# Patient Record
Sex: Female | Born: 1990 | Race: White | Hispanic: No | Marital: Married | Smoking: Former smoker
Health system: Southern US, Community
[De-identification: ages and names within clinical notes are randomized; demographics above are authoritative.]

## PROBLEM LIST (undated history)

## (undated) DIAGNOSIS — F32A Depression, unspecified: Secondary | ICD-10-CM

## (undated) DIAGNOSIS — E039 Hypothyroidism, unspecified: Secondary | ICD-10-CM

## (undated) DIAGNOSIS — E079 Disorder of thyroid, unspecified: Secondary | ICD-10-CM

## (undated) DIAGNOSIS — F329 Major depressive disorder, single episode, unspecified: Secondary | ICD-10-CM

## (undated) DIAGNOSIS — Z720 Tobacco use: Secondary | ICD-10-CM

## (undated) DIAGNOSIS — F319 Bipolar disorder, unspecified: Secondary | ICD-10-CM

## (undated) DIAGNOSIS — Z87442 Personal history of urinary calculi: Secondary | ICD-10-CM

## (undated) DIAGNOSIS — K219 Gastro-esophageal reflux disease without esophagitis: Secondary | ICD-10-CM

## (undated) DIAGNOSIS — R7303 Prediabetes: Secondary | ICD-10-CM

## (undated) HISTORY — PX: TOTAL ABDOMINAL HYSTERECTOMY: SHX209

## (undated) HISTORY — DX: Bipolar disorder, unspecified: F31.9

## (undated) HISTORY — DX: Depression, unspecified: F32.A

## (undated) HISTORY — PX: ABDOMINAL HYSTERECTOMY: SHX81

## (undated) HISTORY — DX: Major depressive disorder, single episode, unspecified: F32.9

## (undated) HISTORY — PX: MOUTH SURGERY: SHX715

## (undated) HISTORY — DX: Disorder of thyroid, unspecified: E07.9

## (undated) HISTORY — DX: Tobacco use: Z72.0

---
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
## 2012-01-21 ENCOUNTER — Observation Stay: Payer: Self-pay

## 2012-01-21 LAB — CBC WITH DIFFERENTIAL/PLATELET
Eosinophil %: 1.5 %
Lymphocyte #: 1.8 10*3/uL (ref 1.0–3.6)
MCH: 31.2 pg (ref 26.0–34.0)
MCV: 86 fL (ref 80–100)
Monocyte %: 7.7 %
Neutrophil #: 10.6 10*3/uL — ABNORMAL HIGH (ref 1.4–6.5)
RBC: 3.5 10*6/uL — ABNORMAL LOW (ref 3.80–5.20)
WBC: 13.6 10*3/uL — ABNORMAL HIGH (ref 3.6–11.0)

## 2012-01-21 LAB — COMPREHENSIVE METABOLIC PANEL
Albumin: 2.5 g/dL — ABNORMAL LOW (ref 3.8–5.6)
Calcium, Total: 8 mg/dL — ABNORMAL LOW (ref 9.0–10.7)
Co2: 24 mmol/L (ref 16–25)
EGFR (Non-African Amer.): >60
Potassium: 3.4 mmol/L (ref 3.3–4.7)
Sodium: 143 mmol/L — ABNORMAL HIGH (ref 132–141)

## 2012-01-21 LAB — URINALYSIS, COMPLETE
Bacteria: NONE SEEN
Bilirubin,UR: NEGATIVE
Blood: NEGATIVE
Glucose,UR: NEGATIVE mg/dL (ref 0–75)
Ketone: NEGATIVE
Leukocyte Esterase: NEGATIVE
Ph: 7 (ref 4.5–8.0)
RBC,UR: NONE SEEN /HPF (ref 0–5)
Squamous Epithelial: NONE SEEN

## 2012-01-22 ENCOUNTER — Emergency Department: Payer: Self-pay | Admitting: Unknown Physician Specialty

## 2012-01-22 LAB — COMPREHENSIVE METABOLIC PANEL
Albumin: 2.6 g/dL — ABNORMAL LOW (ref 3.8–5.6)
Anion Gap: 9 (ref 7–16)
Chloride: 107 mmol/L (ref 97–107)
Creatinine: 0.51 mg/dL — ABNORMAL LOW (ref 0.60–1.30)
Potassium: 3.8 mmol/L (ref 3.3–4.7)
SGOT(AST): 19 U/L (ref 0–26)
SGPT (ALT): 28 U/L
Total Protein: 6.2 g/dL — ABNORMAL LOW (ref 6.4–8.6)

## 2012-01-22 LAB — CBC WITH DIFFERENTIAL/PLATELET
Basophil %: 0.5 %
Eosinophil #: 0.2 10*3/uL (ref 0.0–0.7)
HCT: 33 % — ABNORMAL LOW (ref 35.0–47.0)
HGB: 11.9 g/dL — ABNORMAL LOW (ref 12.0–16.0)
Lymphocyte %: 12.7 %
Monocyte %: 8 %
Neutrophil #: 8.3 10*3/uL — ABNORMAL HIGH (ref 1.4–6.5)
RBC: 3.77 10*6/uL — ABNORMAL LOW (ref 3.80–5.20)
RDW: 13.3 % (ref 11.5–14.5)
WBC: 10.7 10*3/uL (ref 3.6–11.0)

## 2012-03-03 ENCOUNTER — Observation Stay: Payer: Self-pay

## 2012-03-03 LAB — URINALYSIS, COMPLETE
Bilirubin,UR: NEGATIVE
Ph: 7 (ref 4.5–8.0)
Protein: 30

## 2012-03-05 LAB — URINE CULTURE

## 2012-05-15 LAB — CBC WITH DIFFERENTIAL/PLATELET
Eosinophil %: 0.4 %
HGB: 12.6 g/dL (ref 12.0–16.0)
Lymphocyte %: 10.5 %
MCH: 30.5 pg (ref 26.0–34.0)
Monocyte #: 0.8 x10 3/mm (ref 0.2–0.9)
Monocyte %: 6.1 %
Neutrophil %: 82.4 %
Platelet: 192 10*3/uL (ref 150–440)
RBC: 4.14 10*6/uL (ref 3.80–5.20)
WBC: 13.1 10*3/uL — ABNORMAL HIGH (ref 3.6–11.0)

## 2012-05-16 ENCOUNTER — Inpatient Hospital Stay: Payer: Self-pay

## 2012-11-24 ENCOUNTER — Emergency Department: Payer: Self-pay | Admitting: Emergency Medicine

## 2012-11-24 LAB — COMPREHENSIVE METABOLIC PANEL
Alkaline Phosphatase: 104 U/L (ref 82–169)
Anion Gap: 5 — ABNORMAL LOW (ref 7–16)
BUN: 14 mg/dL (ref 7–18)
Bilirubin,Total: 0.2 mg/dL (ref 0.2–1.0)
Calcium, Total: 9.1 mg/dL (ref 9.0–10.7)
Osmolality: 278 (ref 275–301)

## 2012-11-24 LAB — URINALYSIS, COMPLETE
Glucose,UR: NEGATIVE mg/dL (ref 0–75)
Nitrite: NEGATIVE
Ph: 5 (ref 4.5–8.0)

## 2012-11-24 LAB — CBC
HCT: 40.5 % (ref 35.0–47.0)
HGB: 14.1 g/dL (ref 12.0–16.0)
MCHC: 34.7 g/dL (ref 32.0–36.0)

## 2012-11-24 LAB — DRUG SCREEN, URINE
Amphetamines, Ur Screen: NEGATIVE (ref ?–1000)
Tricyclic, Ur Screen: NEGATIVE (ref ?–1000)

## 2012-11-24 LAB — SALICYLATE LEVEL: Salicylates, Serum: <1.7 mg/dL

## 2012-11-24 LAB — TSH: Thyroid Stimulating Horm: 4.61 u[IU]/mL — ABNORMAL HIGH

## 2013-02-26 ENCOUNTER — Emergency Department: Payer: Self-pay | Admitting: Emergency Medicine

## 2013-02-26 LAB — COMPREHENSIVE METABOLIC PANEL
Bilirubin,Total: 0.2 mg/dL (ref 0.2–1.0)
EGFR (Non-African Amer.): >60
Potassium: 3.8 mmol/L (ref 3.5–5.1)
SGOT(AST): 22 U/L (ref 15–37)
SGPT (ALT): 27 U/L (ref 12–78)
Total Protein: 6.8 g/dL (ref 6.4–8.2)

## 2013-02-26 LAB — CBC
HGB: 13.2 g/dL (ref 12.0–16.0)
MCV: 83 fL (ref 80–100)
Platelet: 211 10*3/uL (ref 150–440)
RDW: 13.8 % (ref 11.5–14.5)

## 2013-02-26 LAB — DRUG SCREEN, URINE
Benzodiazepine, Ur Scrn: NEGATIVE (ref ?–200)
Cannabinoid 50 Ng, Ur ~~LOC~~: NEGATIVE (ref ?–50)
Cocaine Metabolite,Ur ~~LOC~~: NEGATIVE (ref ?–300)
MDMA (Ecstasy)Ur Screen: NEGATIVE (ref ?–500)
Opiate, Ur Screen: NEGATIVE (ref ?–300)
Phencyclidine (PCP) Ur S: NEGATIVE (ref ?–25)

## 2013-02-26 LAB — URINALYSIS, COMPLETE
Bilirubin,UR: NEGATIVE
Ketone: NEGATIVE
Leukocyte Esterase: NEGATIVE
Nitrite: NEGATIVE
Specific Gravity: 1.027 (ref 1.003–1.030)
Squamous Epithelial: 8

## 2013-07-29 ENCOUNTER — Emergency Department: Payer: Self-pay | Admitting: Emergency Medicine

## 2013-07-29 LAB — CBC WITH DIFFERENTIAL/PLATELET
BASOS PCT: 0.6 %
Basophil #: 0 10*3/uL (ref 0.0–0.1)
Eosinophil #: 0.2 10*3/uL (ref 0.0–0.7)
Eosinophil %: 2.3 %
HCT: 38.3 % (ref 35.0–47.0)
HGB: 13.2 g/dL (ref 12.0–16.0)
Lymphocyte #: 1.7 10*3/uL (ref 1.0–3.6)
Lymphocyte %: 24.5 %
MCH: 29.2 pg (ref 26.0–34.0)
MCHC: 34.4 g/dL (ref 32.0–36.0)
MCV: 85 fL (ref 80–100)
Monocyte #: 0.6 x10 3/mm (ref 0.2–0.9)
Monocyte %: 9.2 %
Neutrophil #: 4.4 10*3/uL (ref 1.4–6.5)
Neutrophil %: 63.4 %
Platelet: 199 10*3/uL (ref 150–440)
RBC: 4.51 10*6/uL (ref 3.80–5.20)
RDW: 13.4 % (ref 11.5–14.5)
WBC: 6.9 10*3/uL (ref 3.6–11.0)

## 2013-07-29 LAB — URINALYSIS, COMPLETE
Bacteria: NONE SEEN
Bilirubin,UR: NEGATIVE
GLUCOSE, UR: NEGATIVE mg/dL (ref 0–75)
KETONE: NEGATIVE
Nitrite: NEGATIVE
PH: 6 (ref 4.5–8.0)
Protein: NEGATIVE
SPECIFIC GRAVITY: 1.02 (ref 1.003–1.030)
WBC UR: 17 /HPF (ref 0–5)

## 2013-07-29 LAB — BASIC METABOLIC PANEL
Anion Gap: 6 — ABNORMAL LOW (ref 7–16)
BUN: 13 mg/dL (ref 7–18)
CALCIUM: 8.9 mg/dL (ref 8.5–10.1)
CHLORIDE: 107 mmol/L (ref 98–107)
CO2: 26 mmol/L (ref 21–32)
CREATININE: 0.85 mg/dL (ref 0.60–1.30)
EGFR (African American): >60
Glucose: 87 mg/dL (ref 65–99)
Osmolality: 277 (ref 275–301)
POTASSIUM: 4.1 mmol/L (ref 3.5–5.1)
SODIUM: 139 mmol/L (ref 136–145)

## 2013-07-29 LAB — WET PREP, GENITAL

## 2013-07-29 LAB — GC/CHLAMYDIA PROBE AMP

## 2013-09-30 ENCOUNTER — Emergency Department: Payer: Self-pay | Admitting: Emergency Medicine

## 2013-09-30 LAB — COMPREHENSIVE METABOLIC PANEL
ALBUMIN: 3.3 g/dL — AB (ref 3.4–5.0)
ALK PHOS: 75 U/L
ALT: 28 U/L (ref 12–78)
ANION GAP: 5 — AB (ref 7–16)
AST: 21 U/L (ref 15–37)
BUN: 11 mg/dL (ref 7–18)
Bilirubin,Total: 0.2 mg/dL (ref 0.2–1.0)
CO2: 25 mmol/L (ref 21–32)
Calcium, Total: 8.4 mg/dL — ABNORMAL LOW (ref 8.5–10.1)
Chloride: 108 mmol/L — ABNORMAL HIGH (ref 98–107)
Creatinine: 0.69 mg/dL (ref 0.60–1.30)
EGFR (African American): >60
EGFR (Non-African Amer.): >60
GLUCOSE: 90 mg/dL (ref 65–99)
Osmolality: 275 (ref 275–301)
Potassium: 4 mmol/L (ref 3.5–5.1)
Sodium: 138 mmol/L (ref 136–145)
TOTAL PROTEIN: 7.1 g/dL (ref 6.4–8.2)

## 2013-09-30 LAB — CBC
HCT: 38.2 % (ref 35.0–47.0)
HGB: 12.8 g/dL (ref 12.0–16.0)
MCH: 28.1 pg (ref 26.0–34.0)
MCHC: 33.5 g/dL (ref 32.0–36.0)
MCV: 84 fL (ref 80–100)
Platelet: 223 10*3/uL (ref 150–440)
RBC: 4.56 10*6/uL (ref 3.80–5.20)
RDW: 13.8 % (ref 11.5–14.5)
WBC: 7.6 10*3/uL (ref 3.6–11.0)

## 2013-09-30 LAB — URINALYSIS, COMPLETE
Bacteria: NONE SEEN
SPECIFIC GRAVITY: 1.02 (ref 1.003–1.030)

## 2013-09-30 LAB — TROPONIN I: Troponin-I: <0.02 ng/mL

## 2013-09-30 LAB — TSH: Thyroid Stimulating Horm: 2.9 u[IU]/mL

## 2013-09-30 LAB — CK TOTAL AND CKMB (NOT AT ARMC)
CK, TOTAL: 144 U/L
CK-MB: 1.2 ng/mL (ref 0.5–3.6)

## 2013-09-30 LAB — PREGNANCY, URINE: Pregnancy Test, Urine: NEGATIVE m[IU]/mL
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
## 2014-10-31 NOTE — H&P (Signed)
L&D Evaluation:  History:   HPI 24 year old G1P0 at 40 weeks 1 day presents to L&D with ruptured membranes. EDD 05/14/12, [place] at [facility] notable for early entry to care, kidney stone at 24 weeks, no other significant events.  Labs: O Pos, RI, Varicella non immune GBS POSITIVE    Presents with leaking fluid    Patient's Medical History No Chronic Illness    Patient's Surgical History none    Medications Pre Natal Vitamins    Allergies NKDA    Social History none    Family History Non-Contributory   ROS:   ROS All systems were reviewed.  HEENT, CNS, GI, GU, Respiratory, CV, Renal and Musculoskeletal systems were found to be normal.   Exam:   Vital Signs stable    Urine Protein not completed    General no apparent distress    Mental Status clear    Chest clear    Abdomen gravid, non-tender    Estimated Fetal Weight Average for gestational age    Edema no edema    Pelvic no external lesions, 2-3/70/-1    Mebranes Ruptured    Description clear    FHT normal rate with no decels    Ucx irregular   Impression:   Impression early labor   Plan:   Plan EFM/NST, monitor contractions and for cervical change, antibiotics for GBBS prophylaxis, start Pitocin for augmentation if no regular ctx's in 1-2 hours   Electronic Signatures: Shann Medal (CNM)  (Signed [phone] 20:18)  Authored: L&D Evaluation   Last Updated: 23-Nov-13 20:18 by Shann Medal (CNM)

## 2014-10-31 NOTE — H&P (Signed)
L&D Evaluation:  History Expanded:   HPI 24 yo G1 at 24 weeks (per pt, no records available.) Presents with c/o low abdominal and right upper quadrant pain since this afternoon, gradual onset and is now a sharp pain. Also c/o headache, with same complaint the last few nights, relieved with Tylenol and sleep. Pt ate tomato biscuit and roast beef sandwich today. Denies constipation, nausea or diarrhea. +FM, no LOF or VB. PNC at [facility].    Patient's Medical History No Chronic Illness    Patient's Surgical History none    Medications Pre Natal Vitamins    Allergies NKDA   ROS:   ROS see HPI   Exam:   Vital Signs stable    General no apparent distress    Mental Status clear    Chest clear    Heart no murmur/gallop/rubs    Abdomen gravid, RUQ tenderness (mild), lower central tenderness    Edema no edema  pt reports h/o pedal edema    FHT + FHR, appropriate for gestational age    Ucx absent    Other UA from In & out catheter: negative   Impression:   Impression IUP at 24 weeks with abdominal pain   Plan:   Comments Fioricet for headache Heat pack for abdominal pain GB Ultrasound   Electronic Signatures: Ander Purpura (CNM)  (Signed 31-Jul-13 19:50)  Authored: L&D Evaluation   Last Updated: 31-Jul-13 19:50 by Ander Purpura (CNM)

## 2014-12-04 ENCOUNTER — Ambulatory Visit: Payer: Medicaid Other | Admitting: Licensed Clinical Social Worker

## 2014-12-04 ENCOUNTER — Other Ambulatory Visit: Payer: Self-pay

## 2014-12-04 DIAGNOSIS — F319 Bipolar disorder, unspecified: Secondary | ICD-10-CM | POA: Insufficient documentation

## 2014-12-04 DIAGNOSIS — F332 Major depressive disorder, recurrent severe without psychotic features: Secondary | ICD-10-CM | POA: Insufficient documentation

## 2014-12-04 DIAGNOSIS — Z7189 Other specified counseling: Secondary | ICD-10-CM | POA: Insufficient documentation

## 2014-12-04 DIAGNOSIS — F32A Depression, unspecified: Secondary | ICD-10-CM | POA: Insufficient documentation

## 2014-12-04 DIAGNOSIS — F329 Major depressive disorder, single episode, unspecified: Secondary | ICD-10-CM | POA: Insufficient documentation

## 2014-12-05 ENCOUNTER — Ambulatory Visit: Payer: Medicaid Other | Admitting: Licensed Clinical Social Worker

## 2014-12-12 ENCOUNTER — Ambulatory Visit (INDEPENDENT_AMBULATORY_CARE_PROVIDER_SITE_OTHER): Payer: Medicaid Other | Admitting: Licensed Clinical Social Worker

## 2014-12-12 DIAGNOSIS — F411 Generalized anxiety disorder: Secondary | ICD-10-CM

## 2014-12-12 DIAGNOSIS — F331 Major depressive disorder, recurrent, moderate: Secondary | ICD-10-CM

## 2014-12-12 NOTE — Progress Notes (Signed)
THERAPIST PROGRESS NOTE  Session Time:   1:10 p.m. - 2:15 p.m.  Participation Level: Active  Behavioral Response: CasualAlertAnxious and Depressed  Type of Therapy: Individual Therapy  Treatment Goals addressed: Anger, Communication: Family conflict and Coping  Interventions: CBT, Solution Focused, Strength-based, Supportive and Reframing  Summary: Arbadella Kimbler is a 24 y.o. female who presents with ongoing depression and anxiety, complicated further by family dynamics, specifically need for validation and praise from her mother who client describes as not being satisfied with client's choices and who client experiences as critical.  "My anxiety has gotten so bad that I don't want to get up and get dressed."  Worse over the past week and a half. Fiance has noticed that Danaysha has been more on edge and with hands shaking and difficulty focusing.  Client descrbed being on edge as "cranky and I'm very irritable."  "I'm stressing about everything."  New stressor around the preparations for making a move to a home next to paternal grand-parents and purchasing appliances for the home.  She and spouse are excited about this yet Ednamae shared various comments from her mother that seem to be hurtful and cause some doubt in client's mind.  Fiance', Will continues to be supportive per Joellen Jersey.  He has officially adopted her almost 71 year old daughter, Addy, and this was another decision that client's mother disagreed with.  Domingo Mend' has suggested to client that she needs to set more firm boundaries with her mother.  She shared recent argument that she and fiance' got into around sharing responsibilities and she voiced some disappointment in her approach with him.  Kimba admits that fiance' does nice things for her yet her preference would be that he would do more of these things and on a consistent basis.  Client voiced much confusion around "Why does she do this to me? I don't understand where all of the anger  is coming from and why it come to me."  Family dynamics were discussed in terms of how her parents use to argue and ultimately what ended the marriage with Latiya stating "I'm glad they got divorced."  Historically it sounds as if both client's mother and father had anger issues and there were frequent conflicts between the parents.  Rilla admits that in 2014 when seeing Dr. Annitta Jersey and this therapist that she only took the medications prescribed for about two months.  Client is at a place where she would like a medication evaluation and seems ready to be compliant.  She also recognizes that she needs to increase her social contacts/supports citing "I tell my momma everything."   Goal:  "Managing anger better and working to try to get my anxiety down."   Suicidal/Homicidal: Negativewithout intent/plan  Therapist Response: Explored with client and processed identified thoughts, feelings and fears associated with both real and imagined rejection and abandonment in personal relationships. Assisted client to begin to increase insight and identification into patterns of certain behaviors and the resulting consequences.  Used motivational interviewing to assist and encourage patient through the change process and reinforced importance of understanding and setting healthier boundaries with her mother which may include increasing physical and emotional distance.  Validated feelings expressed.  Offered psycho-education on CBT to assist patient with the identification of negative distortions and irrational thoughts. Encouraged patient to verbalize alternative and factual responses which challenge thought distortions.   Provided Birtie with a hand out that addresses anger and how this and depression can be cover ups for underlying  feelings like shame and guilt and vulnerability.    Diagnosis:   Major Depressive Disorder, Recurrent, Moderate  (Rule out Bi-polar Disorder, MRE, Mixed, Moderate)   Generalized Anxiety  Disorder  Plan: Return again in 2 weeks.  Nakeshia will call back to [facility] to schedule an appointment with one of our Psychiatrist.  She is aware of services at [facility] walk in clinic and will follow up there for medication evaluation PRN.  Client will keep all appointments as scheduled.     Miguel Dibble, LCSW 12/12/2014

## 2014-12-18 ENCOUNTER — Ambulatory Visit: Payer: Medicaid Other | Admitting: Psychiatry

## 2015-01-24 ENCOUNTER — Ambulatory Visit: Payer: Medicaid Other | Admitting: Psychiatry

## 2015-01-26 ENCOUNTER — Ambulatory Visit: Payer: Self-pay | Admitting: Licensed Clinical Social Worker

## 2015-02-16 ENCOUNTER — Ambulatory Visit: Payer: Medicaid Other | Admitting: Psychiatry

## 2016-01-23 ENCOUNTER — Ambulatory Visit (INDEPENDENT_AMBULATORY_CARE_PROVIDER_SITE_OTHER): Payer: Medicaid Other | Admitting: Unknown Physician Specialty

## 2016-01-23 ENCOUNTER — Encounter: Payer: Self-pay | Admitting: Unknown Physician Specialty

## 2016-01-23 DIAGNOSIS — D229 Melanocytic nevi, unspecified: Secondary | ICD-10-CM

## 2016-01-23 DIAGNOSIS — D239 Other benign neoplasm of skin, unspecified: Secondary | ICD-10-CM | POA: Diagnosis not present

## 2016-01-23 NOTE — Assessment & Plan Note (Signed)
With ABCD changes.  After informed consent, infiltrated area with lidocaine.  Nevi removed with punch biopsy and sent to pathology

## 2016-01-23 NOTE — Progress Notes (Signed)
BP (!) 141/78 (BP Location: Left Arm, Patient Position: Sitting, Cuff Size: Normal)   Pulse 81   Temp 97.9 F (36.6 C)   Ht 5' 6.1" (1.679 m)   Wt 177 lb (80.3 kg)   LMP 12/25/2015 (Exact Date)   SpO2 100%   BMI 28.48 kg/m    Subjective:    Patient ID: Donna Cox, female    DOB: 1991-04-08, 25 y.o.   MRN: [id number]  HPI: Donna Cox is a 25 y.o. female  Chief Complaint  Patient presents with  . Nevus    pt states she has a mole on her right arm that she would like looked at, states it has changed within the last month   Pt with mole on right arm which has grown dramatically in the last month.  No pain but it itches  Relevant past medical, surgical, family and social history reviewed and updated as indicated. Interim medical history since our last visit reviewed. Allergies and medications reviewed and updated.  Review of Systems  Per HPI unless specifically indicated above     Objective:    BP (!) 141/78 (BP Location: Left Arm, Patient Position: Sitting, Cuff Size: Normal)   Pulse 81   Temp 97.9 F (36.6 C)   Ht 5' 6.1" (1.679 m)   Wt 177 lb (80.3 kg)   LMP 12/25/2015 (Exact Date)   SpO2 100%   BMI 28.48 kg/m   Wt Readings from Last 3 Encounters:  01/23/16 177 lb (80.3 kg)  12/13/13 206 lb (93.4 kg)    Physical Exam  Constitutional: She is oriented to person, place, and time. She appears well-developed and well-nourished. No distress.  HENT:  Head: Normocephalic and atraumatic.  Eyes: Conjunctivae and lids are normal. Right eye exhibits no discharge. Left eye exhibits no discharge. No scleral icterus.  Neck: Normal range of motion. Neck supple. No JVD present. Carotid bruit is not present.  Cardiovascular: Normal rate, regular rhythm and normal heart sounds.   Pulmonary/Chest: Effort normal and breath sounds normal.  Abdominal: Normal appearance. There is no splenomegaly or hepatomegaly.  Musculoskeletal: Normal range of motion.  Neurological: She is  alert and oriented to person, place, and time.  Skin: Skin is warm, dry and intact. No rash noted. No pallor.  Mole right arm which is smaller than a pencil eraser but varigated coloring and irregular borders.    Psychiatric: She has a normal mood and affect. Her behavior is normal. Judgment and thought content normal.    Results for orders placed or performed in visit on 09/30/13  Troponin I  Result Value Ref Range   Troponin-I < 0.02 ng/mL  CK total and CKMB (cardiac)  Result Value Ref Range   CK, Total 144 Unit/L   CK-MB 1.2 0.5 - 3.6 ng/mL  CBC  Result Value Ref Range   WBC 7.6 3.6 - 11.0 x10 3/mm 3   RBC 4.56 3.80 - 5.20 X10 6/mm 3   HGB 12.8 12.0 - 16.0 g/dL   HCT 38.2 35.0 - 47.0 %   MCV 84 80 - 100 fL   MCH 28.1 26.0 - 34.0 pg   MCHC 33.5 32.0 - 36.0 g/dL   RDW 13.8 11.5 - 14.5 %   Platelet 223 150 - 440 x10 3/mm 3  Comprehensive metabolic panel  Result Value Ref Range   Glucose 90 65 - 99 mg/dL   BUN 11 7 - 18 mg/dL   Creatinine 0.69 0.60 - 1.30 mg/dL  Sodium 138 136 - 145 mmol/L   Potassium 4.0 3.5 - 5.1 mmol/L   Chloride 108 (H) 98 - 107 mmol/L   Co2 25 21 - 32 mmol/L   Calcium, Total 8.4 (L) 8.5 - 10.1 mg/dL   SGOT(AST) 21 15 - 37 Unit/L   SGPT (ALT) 28 12 - 78 U/L   Alkaline Phosphatase 75 Unit/L   Albumin 3.3 (L) 3.4 - 5.0 g/dL   Total Protein 7.1 6.4 - 8.2 g/dL   Bilirubin,Total 0.2 0.2 - 1.0 mg/dL   Osmolality 275 275 - 301   Anion Gap 5 (L) 7 - 16   EGFR (African American) >60    EGFR (Non-African Amer.) >60   TSH  Result Value Ref Range   Thyroid Stimulating Horm 2.90 uIU/mL  Urinalysis, Complete  Result Value Ref Range   Color - urine RED    Clarity - urine BLOODY    Glucose,UR see comment 0 - 75 mg/dL   Bilirubin,UR see comment NEGATIVE   Ketone see comment NEGATIVE   Specific Gravity 1.020 1.003 - 1.030   Blood see comment NEGATIVE   Ph see comment 4.5 - 8.0   Protein see comment NEGATIVE   Nitrite SEE COMMENT NEGATIVE   Leukocyte  Esterase see comment NEGATIVE   RBC,UR 3245 /HPF 0 - 5 /HPF   WBC UR 38 /HPF 0 - 5 /HPF   Bacteria NONE SEEN NONE SEEN   Squamous Epithelial 51 /HPF   Pregnancy, urine  Result Value Ref Range   Pregnancy Test, Urine NEGATIVE mIU/mL      Assessment & Plan:   Problem List Items Addressed This Visit      Unprioritized   Atypical nevi    With ABCD changes.  After informed consent, infiltrated area with lidocaine.  Nevi removed with punch biopsy and sent to pathology      Relevant Orders   Pathology    Other Visit Diagnoses   None.      Follow up plan: No Follow-up on file.

## 2016-01-28 LAB — PATHOLOGY

## 2016-03-10 ENCOUNTER — Ambulatory Visit
Admission: RE | Admit: 2016-03-10 | Discharge: 2016-03-10 | Disposition: A | Discharge status: Home / Self Care | Payer: Medicaid Other | Source: Ambulatory Visit | Attending: Family Medicine | Admitting: Family Medicine

## 2016-03-10 ENCOUNTER — Ambulatory Visit (INDEPENDENT_AMBULATORY_CARE_PROVIDER_SITE_OTHER): Payer: Medicaid Other | Admitting: Family Medicine

## 2016-03-10 ENCOUNTER — Encounter: Payer: Self-pay | Admitting: Family Medicine

## 2016-03-10 VITALS — BP 103/69 | HR 103 | Temp 99.3°F | Ht 66.5 in | Wt 182.0 lb

## 2016-03-10 DIAGNOSIS — X58XXXA Exposure to other specified factors, initial encounter: Secondary | ICD-10-CM | POA: Diagnosis not present

## 2016-03-10 DIAGNOSIS — S8992XA Unspecified injury of left lower leg, initial encounter: Secondary | ICD-10-CM

## 2016-03-10 NOTE — Patient Instructions (Signed)
Follow up as needed

## 2016-03-10 NOTE — Progress Notes (Signed)
   BP 103/69   Pulse (!) 103   Temp 99.3 F (37.4 C)   Ht 5' 6.5" (1.689 m)   Wt 182 lb (82.6 kg)   LMP 02/28/2016 (Exact Date)   SpO2 98%   BMI 28.94 kg/m    Subjective:    Patient ID: Donna Cox, female    DOB: 1991/03/04, 25 y.o.   MRN: [id number]  HPI: Donna Cox is a 25 y.o. female  Chief Complaint  Patient presents with  . Knee Pain    left knee since Sunday, but has been bothering off and on for years. Was running and heard a pop and has been swollen and bruised since.    Was playing sports yesterday, felt like she landed wrong and heard a pop. Swelling, bruising, both sharp and dull pain depending on activity. Can bear weight but it's very painful. Has been taking ibuprofen every 6 hours. Has been using heating pad and icy hot with no relief. No hx of injury in the knee.   Relevant past medical, surgical, family and social history reviewed and updated as indicated. Interim medical history since our last visit reviewed. Allergies and medications reviewed and updated.  Review of Systems  Constitutional: Negative.   HENT: Negative.   Respiratory: Negative.   Cardiovascular: Negative.   Gastrointestinal: Negative.   Genitourinary: Negative.   Musculoskeletal: Positive for arthralgias and joint swelling.  Skin:       Bruising   Neurological: Negative.   Psychiatric/Behavioral: Negative.     Per HPI unless specifically indicated above     Objective:    BP 103/69   Pulse (!) 103   Temp 99.3 F (37.4 C)   Ht 5' 6.5" (1.689 m)   Wt 182 lb (82.6 kg)   LMP 02/28/2016 (Exact Date)   SpO2 98%   BMI 28.94 kg/m   Wt Readings from Last 3 Encounters:  03/10/16 182 lb (82.6 kg)  01/23/16 177 lb (80.3 kg)  12/13/13 206 lb (93.4 kg)    Physical Exam  Constitutional: She is oriented to person, place, and time. She appears well-developed and well-nourished.  HENT:  Head: Atraumatic.  Eyes: Conjunctivae are normal. No scleral icterus.  Neck: Normal range of  motion. Neck supple.  Cardiovascular: Normal heart sounds.   Pulmonary/Chest: Effort normal. No respiratory distress.  Musculoskeletal: She exhibits edema (trace edema left knee) and tenderness (TTP over left patella).  Left knee: Pain with both varus and valgus stress Pain and significant crepitus to passive ROM  Neurological: She is alert and oriented to person, place, and time.  Skin: Skin is warm and dry.  Psychiatric: She has a normal mood and affect. Her behavior is normal.  Nursing note and vitals reviewed.     Assessment & Plan:   Problem List Items Addressed This Visit    None    Visit Diagnoses    Left knee injury, initial encounter    -  Primary   X-ray left knee today, await results. Continue ibuprofen, start knee brace, warm soaks, heating pad, icy hot massage, rest    Relevant Orders   DG Knee Complete 4 Views Left       Follow up plan: Return if symptoms worsen or fail to improve.

## 2016-03-11 ENCOUNTER — Telehealth: Payer: Self-pay | Admitting: Family Medicine

## 2016-03-11 NOTE — Telephone Encounter (Signed)
Patient notified

## 2016-03-11 NOTE — Telephone Encounter (Signed)
Please call pt and let her know that her knee x-ray was completely normal. She should continue wearing brace, resting, ice/heat, massage with icy hot, tylenol and ibuprofen for pain. If no improvement after 2 weeks or so, will send her to orthopedics for evaluation.

## 2016-03-24 ENCOUNTER — Telehealth: Payer: Self-pay | Admitting: Unknown Physician Specialty

## 2016-03-24 DIAGNOSIS — M545 Low back pain, unspecified: Secondary | ICD-10-CM

## 2016-03-24 NOTE — Telephone Encounter (Signed)
Called and spoke to patient. She stated that she has been having some tightness in her back. States she can pop it for some relief but then the pain comes back.

## 2016-03-24 NOTE — Telephone Encounter (Signed)
Pt called stated she would like a referral to a Chiropractor. Pt prefers Forensic psychologist in [place]. Please call if further information is needed. Thanks.

## 2016-09-18 ENCOUNTER — Ambulatory Visit (INDEPENDENT_AMBULATORY_CARE_PROVIDER_SITE_OTHER): Payer: Medicaid Other | Admitting: Family Medicine

## 2016-09-18 ENCOUNTER — Encounter: Payer: Self-pay | Admitting: Family Medicine

## 2016-09-18 VITALS — BP 116/62 | HR 94 | Temp 99.0°F | Wt 212.0 lb

## 2016-09-18 DIAGNOSIS — L84 Corns and callosities: Secondary | ICD-10-CM | POA: Diagnosis not present

## 2016-09-18 NOTE — Progress Notes (Signed)
BP 116/62   Pulse 94   Temp 99 F (37.2 C)   Wt 212 lb (96.2 kg)   LMP 09/08/2016 (Approximate)   SpO2 97%   BMI 33.71 kg/m    Subjective:    Patient ID: Donna Cox, female    DOB: 1991/01/24, 26 y.o.   MRN: [id number]  HPI: Donna Cox is a 26 y.o. female  Chief Complaint  Patient presents with  . Callouses    bottom of both feet, starting to loose feeling. Been going on for a while, but worse over the last month.   Patient presents with severe calluses on b/l feet that have been present her entire life, but starting to become worse the past month. Now experiencing some numbness in the areas they are severely cracked. For as long as she can remember, she has been doing the foot shavers and exfoliation after showers, and has tried almost every cream with no relief. Wears socks at night to help cream penetrate further. States her mother and sister also have this problem, but none of them have sought care for it.   Relevant past medical, surgical, family and social history reviewed and updated as indicated. Interim medical history since our last visit reviewed. Allergies and medications reviewed and updated.  Review of Systems  Constitutional: Negative.   Respiratory: Negative.   Cardiovascular: Negative.   Musculoskeletal: Negative.   Skin:       Calluses b/l feet  Neurological: Positive for numbness.  Psychiatric/Behavioral: Negative.    Per HPI unless specifically indicated above     Objective:    BP 116/62   Pulse 94   Temp 99 F (37.2 C)   Wt 212 lb (96.2 kg)   LMP 09/08/2016 (Approximate)   SpO2 97%   BMI 33.71 kg/m   Wt Readings from Last 3 Encounters:  09/18/16 212 lb (96.2 kg)  03/10/16 182 lb (82.6 kg)  01/23/16 177 lb (80.3 kg)    Physical Exam  Constitutional: She is oriented to person, place, and time. She appears well-developed and well-nourished. No distress.  HENT:  Head: Atraumatic.  Eyes: Conjunctivae are normal.  Neck: Normal range of  motion. Neck supple.  Cardiovascular: Normal rate.   Pulmonary/Chest: Effort normal. No respiratory distress.  Musculoskeletal: Normal range of motion.  Neurological: She is alert and oriented to person, place, and time.  Skin: Skin is warm and dry.  Significant, thick yellow calluses diffusely across plantar surface of b/l feet with multiple areas of deep cracking that are ttp  Psychiatric: She has a normal mood and affect. Her behavior is normal.  Nursing note and vitals reviewed.   Results for orders placed or performed in visit on 01/23/16  Pathology  Result Value Ref Range   PATH REPORT.SITE OF ORIGIN SPEC Comment    . Comment    PATH REPORT.FINAL DX SPEC Comment    SIGNED OUT BY: Comment    GROSS DESCRIPTION: Comment    . Comment    PAYMENT PROCEDURE Comment       Assessment & Plan:   Problem List Items Addressed This Visit    None    Visit Diagnoses    Foot callus    -  Primary   Given severity, discomfort, and poor response to self care, will refer to dermatology. Continue exfoliation, moisturizers, and good supportive shoe use   Relevant Orders   Ambulatory referral to Dermatology       Follow up plan: Return if symptoms worsen  or fail to improve.

## 2016-09-18 NOTE — Patient Instructions (Signed)
Follow up as needed

## 2016-10-10 ENCOUNTER — Ambulatory Visit (INDEPENDENT_AMBULATORY_CARE_PROVIDER_SITE_OTHER): Payer: Medicaid Other | Admitting: Unknown Physician Specialty

## 2016-10-10 ENCOUNTER — Encounter: Payer: Self-pay | Admitting: Unknown Physician Specialty

## 2016-10-10 DIAGNOSIS — E039 Hypothyroidism, unspecified: Secondary | ICD-10-CM | POA: Insufficient documentation

## 2016-10-10 DIAGNOSIS — Z8639 Personal history of other endocrine, nutritional and metabolic disease: Secondary | ICD-10-CM | POA: Diagnosis not present

## 2016-10-10 DIAGNOSIS — R635 Abnormal weight gain: Secondary | ICD-10-CM | POA: Diagnosis not present

## 2016-10-10 NOTE — Assessment & Plan Note (Signed)
Despite good exercise and healthy diet

## 2016-10-10 NOTE — Patient Instructions (Addendum)
Learning [employer] [email or web address]

## 2016-10-10 NOTE — Progress Notes (Signed)
   BP 137/79   Pulse 97   Temp 98.5 F (36.9 C)   Wt 218 lb 14.4 oz (99.3 kg)   LMP 10/06/2016   SpO2 98%   BMI 34.80 kg/m    Subjective:    Patient ID: Donna Cox, female    DOB: [birth year], 26 y.o.   MRN: [id number]  HPI: Donna Cox is a 26 y.o. female  Chief Complaint  Patient presents with  . Hypothyroidism  . Medication Refill    pt states she is here for a refill on levothyroxine   Pt is here for refills of her thyroid medications.  She is complaining of weight gain and fatigue.  States she has gained 40 pounds in 6 months despite going to the gym and a healthy diet. She wants to get married but would like to feel better first  History of Vit D deficiency and would like level checked.  Takes no supplements  Relevant past medical, surgical, family and social history reviewed and updated as indicated. Interim medical history since our last visit reviewed. Allergies and medications reviewed and updated.  Review of Systems  Per HPI unless specifically indicated above     Objective:    BP 137/79   Pulse 97   Temp 98.5 F (36.9 C)   Wt 218 lb 14.4 oz (99.3 kg)   LMP 10/06/2016   SpO2 98%   BMI 34.80 kg/m   Wt Readings from Last 3 Encounters:  10/10/16 218 lb 14.4 oz (99.3 kg)  09/18/16 212 lb (96.2 kg)  03/10/16 182 lb (82.6 kg)    Physical Exam  Constitutional: She is oriented to person, place, and time. She appears well-developed and well-nourished. No distress.  HENT:  Head: Normocephalic and atraumatic.  Eyes: Conjunctivae and lids are normal. Right eye exhibits no discharge. Left eye exhibits no discharge. No scleral icterus.  Neck: Normal range of motion. Neck supple. No JVD present. Carotid bruit is not present.  Cardiovascular: Normal rate, regular rhythm and normal heart sounds.   Pulmonary/Chest: Effort normal and breath sounds normal.  Abdominal: Normal appearance. There is no splenomegaly or hepatomegaly.  Musculoskeletal: Normal range of  motion.  Neurological: She is alert and oriented to person, place, and time.  Skin: Skin is warm, dry and intact. No rash noted. No pallor.  Psychiatric: She has a normal mood and affect. Her behavior is normal. Judgment and thought content normal.      Assessment & Plan:   Problem List Items Addressed This Visit      Unprioritized   Abnormal weight gain    Despite good exercise and healthy diet      Relevant Orders   Comprehensive metabolic panel   History of vitamin D deficiency   Relevant Orders   VITAMIN D 25 Hydroxy (Vit-D Deficiency, Fractures)   Hypothyroid    Feels symptomatic.  Will check levels      Relevant Orders   TSH       Follow up plan: Return for physical.

## 2016-10-10 NOTE — Assessment & Plan Note (Signed)
Feels symptomatic.  Will check levels

## 2016-10-11 LAB — VITAMIN D 25 HYDROXY (VIT D DEFICIENCY, FRACTURES): VIT D 25 HYDROXY: 27.2 ng/mL — AB (ref 30.0–100.0)

## 2016-10-11 LAB — COMPREHENSIVE METABOLIC PANEL
ALBUMIN: 3.9 g/dL (ref 3.5–5.5)
ALK PHOS: 90 IU/L (ref 39–117)
ALT: 20 IU/L (ref 0–32)
AST: 13 IU/L (ref 0–40)
Albumin/Globulin Ratio: 1.3 (ref 1.2–2.2)
BUN / CREAT RATIO: 15 (ref 9–23)
BUN: 12 mg/dL (ref 6–20)
Bilirubin Total: <0.2 mg/dL (ref 0.0–1.2)
CO2: 24 mmol/L (ref 18–29)
CREATININE: 0.8 mg/dL (ref 0.57–1.00)
Calcium: 9.2 mg/dL (ref 8.7–10.2)
Chloride: 102 mmol/L (ref 96–106)
GFR calc Af Amer: 120 mL/min/{1.73_m2} (ref >59)
GFR calc non Af Amer: 104 mL/min/{1.73_m2} (ref >59)
GLOBULIN, TOTAL: 3.1 g/dL (ref 1.5–4.5)
Glucose: 88 mg/dL (ref 65–99)
Potassium: 4.3 mmol/L (ref 3.5–5.2)
SODIUM: 142 mmol/L (ref 134–144)
Total Protein: 7 g/dL (ref 6.0–8.5)

## 2016-10-11 LAB — TSH: TSH: 3.7 u[IU]/mL (ref 0.450–4.500)

## 2016-10-13 ENCOUNTER — Telehealth: Payer: Self-pay | Admitting: Unknown Physician Specialty

## 2016-10-13 DIAGNOSIS — E039 Hypothyroidism, unspecified: Secondary | ICD-10-CM

## 2016-10-13 MED ORDER — LEVOTHYROXINE SODIUM 50 MCG PO TABS: 50.0000 ug | ORAL_TABLET | Freq: Every day | ORAL | 0 refills | Status: DC

## 2016-10-13 NOTE — Progress Notes (Signed)
Patient notified of results by phone.

## 2016-10-13 NOTE — Telephone Encounter (Signed)
Called to discuss labs.

## 2016-10-13 NOTE — Telephone Encounter (Signed)
TSH upper end of normal.  Symptomatic so will increase Levothyroxine.  Recheck TSH in 3 months

## 2016-10-31 ENCOUNTER — Emergency Department
Admission: EM | Admit: 2016-10-31 | Discharge: 2016-10-31 | Disposition: A | Discharge status: Home / Self Care | Payer: Medicaid Other | Attending: Emergency Medicine | Admitting: Emergency Medicine

## 2016-10-31 ENCOUNTER — Encounter: Payer: Self-pay | Admitting: Emergency Medicine

## 2016-10-31 ENCOUNTER — Emergency Department: Payer: Medicaid Other

## 2016-10-31 DIAGNOSIS — Y92094 Garage of other non-institutional residence as the place of occurrence of the external cause: Secondary | ICD-10-CM | POA: Diagnosis not present

## 2016-10-31 DIAGNOSIS — Y9389 Activity, other specified: Secondary | ICD-10-CM | POA: Insufficient documentation

## 2016-10-31 DIAGNOSIS — S80811A Abrasion, right lower leg, initial encounter: Secondary | ICD-10-CM | POA: Insufficient documentation

## 2016-10-31 DIAGNOSIS — Z79899 Other long term (current) drug therapy: Secondary | ICD-10-CM | POA: Diagnosis not present

## 2016-10-31 DIAGNOSIS — S8012XA Contusion of left lower leg, initial encounter: Secondary | ICD-10-CM | POA: Diagnosis not present

## 2016-10-31 DIAGNOSIS — Z23 Encounter for immunization: Secondary | ICD-10-CM | POA: Insufficient documentation

## 2016-10-31 DIAGNOSIS — Y998 Other external cause status: Secondary | ICD-10-CM | POA: Insufficient documentation

## 2016-10-31 DIAGNOSIS — S80812A Abrasion, left lower leg, initial encounter: Secondary | ICD-10-CM | POA: Insufficient documentation

## 2016-10-31 DIAGNOSIS — Z87891 Personal history of nicotine dependence: Secondary | ICD-10-CM | POA: Diagnosis not present

## 2016-10-31 DIAGNOSIS — S8992XA Unspecified injury of left lower leg, initial encounter: Secondary | ICD-10-CM | POA: Diagnosis present

## 2016-10-31 DIAGNOSIS — T07XXXA Unspecified multiple injuries, initial encounter: Secondary | ICD-10-CM

## 2016-10-31 MED ORDER — IBUPROFEN 600 MG PO TABS: 600.0000 mg | ORAL_TABLET | Freq: Three times a day (TID) | ORAL | 0 refills | Status: DC | PRN

## 2016-10-31 MED ORDER — TETANUS-DIPHTH-ACELL PERTUSSIS 5-2.5-18.5 LF-MCG/0.5 IM SUSP
0.5000 mL | Freq: Once | INTRAMUSCULAR | Status: AC
  Administered 2016-10-31: 0.5 mL via INTRAMUSCULAR
  Filled 2016-10-31: qty 0.5
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
## 2016-10-31 NOTE — Discharge Instructions (Signed)
Ice and elevate left leg as needed for swelling and pain. Ibuprofen 600 mg 3 times a day with food.  Clean any abrasions daily with mild soap and water. Watch for any signs of infection. Follow-up with your primary care provider if any continued problems.
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
## 2016-11-26 ENCOUNTER — Encounter: Payer: Medicaid Other | Admitting: Unknown Physician Specialty

## 2017-01-07 ENCOUNTER — Other Ambulatory Visit: Payer: Medicaid Other

## 2017-01-07 DIAGNOSIS — E039 Hypothyroidism, unspecified: Secondary | ICD-10-CM

## 2017-01-08 LAB — THYROID PANEL WITH TSH
FREE THYROXINE INDEX: 1.3 (ref 1.2–4.9)
T3 Uptake Ratio: 22 % — ABNORMAL LOW (ref 24–39)
T4, Total: 6 ug/dL (ref 4.5–12.0)
TSH: 5.15 u[IU]/mL — ABNORMAL HIGH (ref 0.450–4.500)

## 2017-01-09 ENCOUNTER — Other Ambulatory Visit: Payer: Self-pay | Admitting: Unknown Physician Specialty

## 2017-01-09 MED ORDER — LEVOTHYROXINE SODIUM 75 MCG PO TABS: 75.0000 ug | ORAL_TABLET | Freq: Every day | ORAL | 0 refills | Status: DC

## 2017-01-20 ENCOUNTER — Ambulatory Visit (INDEPENDENT_AMBULATORY_CARE_PROVIDER_SITE_OTHER): Payer: Medicaid Other | Admitting: Obstetrics and Gynecology

## 2017-01-20 ENCOUNTER — Encounter: Payer: Self-pay | Admitting: Obstetrics and Gynecology

## 2017-01-20 VITALS — BP 124/76 | HR 108 | Ht 68.0 in | Wt 230.0 lb

## 2017-01-20 DIAGNOSIS — Z6834 Body mass index (BMI) 34.0-34.9, adult: Secondary | ICD-10-CM | POA: Diagnosis not present

## 2017-01-20 DIAGNOSIS — N912 Amenorrhea, unspecified: Secondary | ICD-10-CM

## 2017-01-20 DIAGNOSIS — N911 Secondary amenorrhea: Secondary | ICD-10-CM | POA: Diagnosis not present

## 2017-01-20 DIAGNOSIS — E669 Obesity, unspecified: Secondary | ICD-10-CM | POA: Diagnosis not present

## 2017-01-20 LAB — POCT URINE PREGNANCY: PREG TEST UR: NEGATIVE

## 2017-01-20 NOTE — Progress Notes (Signed)
Obstetrics & Gynecology Office Visit   Chief Complaint:  Chief Complaint  Patient presents with  . Amenorrhea    irregular cycles  negative home test    History of Present Illness: 26 year old G1P1001 presenting for evaluation of irregular menstrual cycles.  Patient and her husband have been trying to conceive since last year.  Evaluation included normal semen analysis, normal PCOS panel other than evidence of hypothyroidism (her dose of synthroid was increased 2 weeks ago), as well as normal transvaginal ultrasound.  She opted for weight loss management and lost 45lbs in 3 months.  Her menses regulated with this weight loss and addition of synthroid.  She has since gained 50lbs in the last year.    Menses have since become abnormal again, with irregular intervals from 2-6 weeks, lasting up to 14 days.  She denies molimina symptoms.  No headaches, vision changes, or nipple discharge reported by patient.   Review of Systems: 10 point review of systems negative unless otherwise noted in HPI  Past Medical History:  Past Medical History:  Diagnosis Date  . Bipolar disorder ([place])   . Depression   . Thyroid disease   . Tobacco use     Past Surgical History:  History reviewed. No pertinent surgical history.  Gynecologic History: Patient's last menstrual period was 11/13/2016.  Obstetric History: G1P1001  Family History:  Family History  Problem Relation Age of Onset  . Mental illness Mother   . Diabetes Mother   . Thyroid disease Mother   . Heart disease Maternal Grandmother   . Heart disease Maternal Grandfather   . Cancer Maternal Grandfather        lung    Social History:  Social History   Social History  . Marital status: Single    Spouse name: N/A  . Number of children: N/A  . Years of education: N/A   Occupational History  . Not on file.   Social History Main Topics  . Smoking status: Former Smoker    Types: Cigarettes    Quit date: 06/23/2014  .  Smokeless tobacco: Never Used  . Alcohol use Yes     Comment: rare occasion  . Drug use: No  . Sexual activity: Yes   Other Topics Concern  . Not on file   Social History Narrative  . No narrative on file    Allergies:  Allergies  Allergen Reactions  . Anette Guarneri  [Lurasidone Hcl]     crying    Medications: Prior to Admission medications   Medication Sig Start Date End Date Taking? Authorizing Provider  levothyroxine (SYNTHROID, LEVOTHROID) 75 MCG tablet Take 1 tablet (75 mcg total) by mouth daily. 01/09/17  Yes Kathrine Haddock, NP    Physical Exam Vitals:  Vitals:   01/20/17 0821  BP: 124/76  Pulse: (!) 108   Filed Weights   01/20/17 0821  Weight: 230 lb (104.3 kg)    Patient's last menstrual period was 11/13/2016. Body mass index is 34.97 kg/m.  General: NAD HEENT: normocephalic, anicteric, +moon facies, +buffalo hump Pulmonary: No increased work of breathing Neurologic: Grossly intact Psychiatric: mood appropriate, affect full  Female chaperone present for pelvic and breast  portions of the physical exam  Assessment: 26 y.o. G1P1001 AUB-O Plan: Problem List Items Addressed This Visit    None    Visit Diagnoses    Amenorrhea    -  Primary   Relevant Orders   POCT urine pregnancy (Completed)     -  weight gain 50lbs in a year - Cushing features on exam check 24-hr urine cortisol  - PCOS panel - If work up normal has had semen analysis last year that was normal, proceed with letrozole or clomid - A total of 15 minutes were spent in face-to-face contact with the patient during this encounter with over half of that time devoted to counseling and coordination of care.

## 2017-01-20 NOTE — Patient Instructions (Signed)
24-hr urine collection.  Start by voiding in the morning lets say 8AM (that urine gets tossed), you will collect all voids in the next 24-hrs, at 8AM the next day go to the bathroom one last time and that is the last urine that gets collected.  Keep the urine refrigerated and drop it off in clinic at the end of the collection

## 2017-01-23 LAB — TSH+PRL+FSH+TESTT+LH+DHEA S...
17-Hydroxyprogesterone: 47 ng/dL
ANDROSTENEDIONE: 253 ng/dL (ref 41–262)
DHEA SO4: 44.8 ug/dL — AB (ref 110.0–431.7)
FSH: 4.6 m[IU]/mL
LH: 8 m[IU]/mL
Prolactin: 6.1 ng/mL (ref 4.8–23.3)
TESTOSTERONE FREE: 2.2 pg/mL (ref 0.0–4.2)
TSH: 4.52 u[IU]/mL — ABNORMAL HIGH (ref 0.450–4.500)
Testosterone: 43 ng/dL (ref 8–48)

## 2017-01-23 LAB — HEMOGLOBIN A1C
ESTIMATED AVERAGE GLUCOSE: 111 mg/dL
HEMOGLOBIN A1C: 5.5 % (ref 4.8–5.6)

## 2017-01-26 ENCOUNTER — Ambulatory Visit (INDEPENDENT_AMBULATORY_CARE_PROVIDER_SITE_OTHER): Payer: Medicaid Other | Admitting: Family Medicine

## 2017-01-26 ENCOUNTER — Encounter: Payer: Self-pay | Admitting: Family Medicine

## 2017-01-26 ENCOUNTER — Other Ambulatory Visit: Payer: Self-pay

## 2017-01-26 ENCOUNTER — Other Ambulatory Visit: Payer: Self-pay | Admitting: Obstetrics and Gynecology

## 2017-01-26 VITALS — BP 116/76 | HR 88 | Temp 99.3°F | Wt 228.0 lb

## 2017-01-26 DIAGNOSIS — Z6834 Body mass index (BMI) 34.0-34.9, adult: Secondary | ICD-10-CM

## 2017-01-26 DIAGNOSIS — N390 Urinary tract infection, site not specified: Secondary | ICD-10-CM

## 2017-01-26 DIAGNOSIS — E669 Obesity, unspecified: Secondary | ICD-10-CM

## 2017-01-26 DIAGNOSIS — N912 Amenorrhea, unspecified: Secondary | ICD-10-CM

## 2017-01-26 DIAGNOSIS — N911 Secondary amenorrhea: Secondary | ICD-10-CM

## 2017-01-26 MED ORDER — SULFAMETHOXAZOLE-TRIMETHOPRIM 800-160 MG PO TABS: 1.0000 | ORAL_TABLET | Freq: Two times a day (BID) | ORAL | 0 refills | Status: DC

## 2017-01-26 NOTE — Patient Instructions (Signed)
Follow up as needed

## 2017-01-26 NOTE — Progress Notes (Signed)
BP 116/76   Pulse 88   Temp 99.3 F (37.4 C) (Oral)   Wt 228 lb (103.4 kg)   SpO2 98%   BMI 34.67 kg/m    Subjective:    Patient ID: Donna Cox, female    DOB: 06-14-1991, 26 y.o.   MRN: [id number]  HPI: Donna Cox is a 26 y.o. female  Chief Complaint  Patient presents with  . uti SYMPTOMS   Patient presents with one day of sudden onset dysuria, suprapubic pressure, urgency, and hesitancy. Denies fever, chills, back pain, N/V/D. Has not been trying anything OTC for sxs.   Relevant past medical, surgical, family and social history reviewed and updated as indicated. Interim medical history since our last visit reviewed. Allergies and medications reviewed and updated.  Review of Systems  Constitutional: Negative.   HENT: Negative.   Respiratory: Negative.   Cardiovascular: Negative.   Gastrointestinal: Negative.   Genitourinary: Positive for dysuria, frequency and urgency.  Musculoskeletal: Negative.   Neurological: Negative.   Psychiatric/Behavioral: Negative.     Per HPI unless specifically indicated above     Objective:    BP 116/76   Pulse 88   Temp 99.3 F (37.4 C) (Oral)   Wt 228 lb (103.4 kg)   SpO2 98%   BMI 34.67 kg/m   Wt Readings from Last 3 Encounters:  01/26/17 228 lb (103.4 kg)  01/20/17 230 lb (104.3 kg)  10/31/16 190 lb (86.2 kg)    Physical Exam  Constitutional: She is oriented to person, place, and time. She appears well-developed and well-nourished. No distress.  HENT:  Head: Atraumatic.  Eyes: Pupils are equal, round, and reactive to light. Conjunctivae are normal.  Neck: Normal range of motion. Neck supple.  Cardiovascular: Normal rate and normal heart sounds.   Pulmonary/Chest: Effort normal and breath sounds normal. No respiratory distress.  Musculoskeletal: Normal range of motion. She exhibits no tenderness (no CVA tenderness).  Neurological: She is alert and oriented to person, place, and time.  Skin: Skin is warm and dry.    Psychiatric: She has a normal mood and affect.  Nursing note and vitals reviewed.  Results for orders placed or performed in visit on 01/26/17  Microscopic Examination  Result Value Ref Range   WBC, UA >30 (A) 0 - 5 /hpf   RBC, UA >30 (A) 0 - 2 /hpf   Epithelial Cells (non renal) >10 (A) 0 - 10 /hpf   Bacteria, UA Few None seen/Few  Urine Culture, Reflex  Result Value Ref Range   Urine Culture, Routine WILL FOLLOW   UA/M w/rflx Culture, Routine  Result Value Ref Range   Specific Gravity, UA 1.015 1.005 - 1.030   pH, UA 8.5 (H) 5.0 - 7.5   Color, UA Yellow Yellow   Appearance Ur Turbid (A) Clear   Leukocytes, UA 2+ (A) Negative   Protein, UA 2+ (A) Negative/Trace   Glucose, UA Negative Negative   Ketones, UA Trace (A) Negative   RBC, UA 3+ (A) Negative   Bilirubin, UA Negative Negative   Urobilinogen, Ur 0.2 0.2 - 1.0 mg/dL   Nitrite, UA Negative Negative   Microscopic Examination See below:    Urinalysis Reflex Comment       Assessment & Plan:   Problem List Items Addressed This Visit    None    Visit Diagnoses    Acute lower UTI    -  Primary   U/A positive for UTI. Will start bactrim. Push fluids, tylenol  prn. Await urine culture. F/u if worsening or no improvement   Relevant Medications   sulfamethoxazole-trimethoprim (BACTRIM DS,SEPTRA DS) 800-160 MG tablet   Other Relevant Orders   UA/M w/rflx Culture, Routine (Completed)       Follow up plan: Return if symptoms worsen or fail to improve.

## 2017-01-29 LAB — CORTISOL, URINE, FREE
Cortisol (Ur), Free: 30 ug/24 hr (ref 0–50)
Cortisol,F,ug/L,U: 23 ug/L

## 2017-01-30 LAB — UA/M W/RFLX CULTURE, ROUTINE
BILIRUBIN UA: NEGATIVE
GLUCOSE, UA: NEGATIVE
NITRITE UA: NEGATIVE
SPEC GRAV UA: 1.015 (ref 1.005–1.030)
UUROB: 0.2 mg/dL (ref 0.2–1.0)
pH, UA: 8.5 — ABNORMAL HIGH (ref 5.0–7.5)

## 2017-01-30 LAB — MICROSCOPIC EXAMINATION: Epithelial Cells (non renal): >10 /hpf — AB (ref 0–10)

## 2017-01-30 LAB — URINE CULTURE, REFLEX

## 2017-02-16 ENCOUNTER — Telehealth: Payer: Self-pay

## 2017-02-16 NOTE — Telephone Encounter (Signed)
Pt has had no period since May.  Would like to go ahead and start fertility tx. [phone]

## 2017-02-16 NOTE — Telephone Encounter (Signed)
Please schedule appointment w/first available or AMS when he returns (DO NOT overbook)

## 2017-02-18 NOTE — Telephone Encounter (Signed)
Per pt request first available appt with AMS after 3:30. Pt is schedule 03/16/17

## 2017-03-16 ENCOUNTER — Encounter: Payer: Self-pay | Admitting: Obstetrics and Gynecology

## 2017-03-16 ENCOUNTER — Ambulatory Visit (INDEPENDENT_AMBULATORY_CARE_PROVIDER_SITE_OTHER): Payer: Medicaid Other | Admitting: Obstetrics and Gynecology

## 2017-03-16 VITALS — BP 132/80 | HR 102 | Wt 234.0 lb

## 2017-03-16 DIAGNOSIS — N911 Secondary amenorrhea: Secondary | ICD-10-CM

## 2017-03-16 MED ORDER — MEDROXYPROGESTERONE ACETATE 10 MG PO TABS: 10.0000 mg | ORAL_TABLET | Freq: Every day | ORAL | 0 refills | Status: DC

## 2017-03-17 NOTE — Progress Notes (Signed)
Obstetrics & Gynecology Office Visit   Chief Complaint:  Chief Complaint  Patient presents with  . Follow-up    No cycle    History of Present Illness: 26 year old G1P1001 presenting after 3 months of amenorrhea.  Work up has included ultrasound, PCOS labs, cushing labs, and semen analysis.  Based on secondary amenorrhea the most likely diagnosis is PCOS.  She conceived her first pregnancy on clomid and is interested in conceiving again.  We discussed the process involved, new recommendations to use letrozole first line in patient's with PCOS as this has shown increased life birth rates in comparison to clomid.    Review of Systems: 10 point review of systems negative unless otherwise noted in HPI  Past Medical History:  Past Medical History:  Diagnosis Date  . Bipolar disorder ([place])   . Depression   . Thyroid disease   . Tobacco use     Past Surgical History:  No past surgical history on file.  Gynecologic History: Patient's last menstrual period was 11/13/2016.  Obstetric History: G1P1001  Family History:  Family History  Problem Relation Age of Onset  . Mental illness Mother   . Diabetes Mother   . Thyroid disease Mother   . Heart disease Maternal Grandmother   . Heart disease Maternal Grandfather   . Cancer Maternal Grandfather        lung    Social History:  Social History   Social History  . Marital status: Single    Spouse name: N/A  . Number of children: N/A  . Years of education: N/A   Occupational History  . Not on file.   Social History Main Topics  . Smoking status: Former Smoker    Types: Cigarettes    Quit date: 06/23/2014  . Smokeless tobacco: Never Used  . Alcohol use Yes     Comment: rare occasion  . Drug use: No  . Sexual activity: Yes   Other Topics Concern  . Not on file   Social History Narrative  . No narrative on file    Allergies:  Allergies  Allergen Reactions  . Anette Guarneri  [Lurasidone Hcl]     crying     Medications: Prior to Admission medications   Medication Sig Start Date End Date Taking? Authorizing Provider  levothyroxine (SYNTHROID, LEVOTHROID) 75 MCG tablet Take 1 tablet (75 mcg total) by mouth daily. 01/09/17  Yes Kathrine Haddock, NP  medroxyPROGESTERone (PROVERA) 10 MG tablet Take 1 tablet (10 mg total) by mouth daily. 03/16/17 03/26/17  [name], MD    Physical Exam Vitals:  Vitals:   03/16/17 1626  BP: 132/80  Pulse: (!) 102   Patient's last menstrual period was 11/13/2016.  General: NAD HEENT: normocephalic, anicteric Pulmonary: No increased work of breathing Neurologic: Grossly intact Psychiatric: mood appropriate, affect full  Female chaperone present for pelvic and breast  portions of the physical exam  Assessment: 26 y.o. G1P1001 secondary amenorrhea  Plan: Problem List Items Addressed This Visit    None     We discussed the underlying etiologies which may be implicated in a couple experiencing difficulty conceiving.  The average couple will conceive within the span of 1 year with unprotected coitus, with a monthly fecundity rate of 20% or 1 in 5.  Even without further work up or intervention the patient and her partner may be successful in conceiving unassisted, although if an underlying etiology can be identified and addressed fecundity rate may improve.  The  work up entails examining for ovulatory function, tubal patency, and ruling out female factor infertility.  These may be looked at concurrently or sequentially.  The downside of sequential work up is that this method may miss issues if more than one compartment is contributing.  She is aware that tubal factor or moderate to severe female factor infertility will require further consultation with a reproductive endocrinologist.  In the case of anovulation, use of Clomid (clomiphen citrate) or Femara (letrazole) were discussed with the understanding the the later is an off-label, but well supported use.  With  either of these drugs the risk of multiples increases from the standard population rate of 2% to approximately 10%, with higher order multiples possible but unlikely.  Both drugs may require some time to titrate to the appropriate dosage to ensure consistent ovulation.  Cycles will be limited to 6 cycles on each drug secondary to decreasing rates of conception after 6 cycles.  In addition should patient be started on ovulation induction with Clomid she was advised to discontinue the drug for any vision changes as this is a rare but potentially permanent side-effect if medication is continued.  We discussed timing of intercourse as well as the use of ovulation predictor kits identify the patient's fertile window each month.   A total of 15 minutes were spent in face-to-face contact with the patient during this encounter with over half of that time devoted to counseling and coordination of care.

## 2017-03-31 ENCOUNTER — Encounter: Payer: Self-pay | Admitting: Obstetrics and Gynecology

## 2017-03-31 ENCOUNTER — Other Ambulatory Visit: Payer: Self-pay | Admitting: Obstetrics and Gynecology

## 2017-03-31 DIAGNOSIS — N970 Female infertility associated with anovulation: Secondary | ICD-10-CM

## 2017-03-31 MED ORDER — LETROZOLE 2.5 MG PO TABS: 2.5000 mg | ORAL_TABLET | Freq: Every day | ORAL | 0 refills | Status: DC

## 2017-03-31 NOTE — Telephone Encounter (Signed)
Orders in for progesterone lab on 04/20/17

## 2017-03-31 NOTE — Progress Notes (Signed)
LMP 03/30/2017 cycle I letrozole 2.5mg  day 21 progesterone 04/20/2017

## 2017-04-20 ENCOUNTER — Other Ambulatory Visit: Payer: Medicaid Other

## 2017-04-20 DIAGNOSIS — N970 Female infertility associated with anovulation: Secondary | ICD-10-CM

## 2017-04-21 ENCOUNTER — Encounter: Payer: Self-pay | Admitting: Obstetrics and Gynecology

## 2017-04-21 LAB — PROGESTERONE: PROGESTERONE: 0.3 ng/mL

## 2017-05-04 ENCOUNTER — Encounter: Payer: Self-pay | Admitting: Obstetrics and Gynecology

## 2017-05-05 ENCOUNTER — Other Ambulatory Visit: Payer: Self-pay | Admitting: Obstetrics and Gynecology

## 2017-05-05 DIAGNOSIS — N970 Female infertility associated with anovulation: Secondary | ICD-10-CM

## 2017-05-05 MED ORDER — LETROZOLE 2.5 MG PO TABS: 5.0000 mg | ORAL_TABLET | Freq: Every day | ORAL | 0 refills | Status: DC

## 2017-05-05 NOTE — Progress Notes (Signed)
LMP 05/04/17 Cycle I letrozole 5 mg day 21 progesterone on 05/25/2017

## 2017-05-11 ENCOUNTER — Telehealth: Payer: Self-pay | Admitting: Obstetrics and Gynecology

## 2017-05-11 NOTE — Telephone Encounter (Signed)
AMS, please put order in for this. Thanks

## 2017-05-11 NOTE — Telephone Encounter (Signed)
Pt is schedule 05/25/17 for progesterone lab. Order is not in to link to appointment. Please submit order. Thank you

## 2017-05-12 NOTE — Telephone Encounter (Signed)
I put order in on that encoutner but it is a pend order

## 2017-05-25 ENCOUNTER — Other Ambulatory Visit: Payer: Medicaid Other

## 2017-05-25 DIAGNOSIS — N970 Female infertility associated with anovulation: Secondary | ICD-10-CM

## 2017-05-26 ENCOUNTER — Encounter: Payer: Self-pay | Admitting: Obstetrics and Gynecology

## 2017-05-26 LAB — PROGESTERONE: Progesterone: 0.2 ng/mL

## 2017-06-06 ENCOUNTER — Encounter: Payer: Self-pay | Admitting: Obstetrics and Gynecology

## 2017-07-14 ENCOUNTER — Encounter: Payer: Self-pay | Admitting: Obstetrics and Gynecology

## 2017-07-20 ENCOUNTER — Other Ambulatory Visit: Payer: Self-pay | Admitting: Obstetrics and Gynecology

## 2017-07-20 DIAGNOSIS — E039 Hypothyroidism, unspecified: Secondary | ICD-10-CM

## 2017-07-26 ENCOUNTER — Encounter: Payer: Self-pay | Admitting: Obstetrics and Gynecology

## 2017-07-27 ENCOUNTER — Other Ambulatory Visit: Payer: Self-pay | Admitting: Obstetrics and Gynecology

## 2017-07-27 ENCOUNTER — Other Ambulatory Visit: Payer: Medicaid Other

## 2017-07-27 ENCOUNTER — Telehealth: Payer: Self-pay

## 2017-07-27 DIAGNOSIS — O3680X0 Pregnancy with inconclusive fetal viability, not applicable or unspecified: Secondary | ICD-10-CM

## 2017-07-27 DIAGNOSIS — Z3201 Encounter for pregnancy test, result positive: Secondary | ICD-10-CM

## 2017-07-27 DIAGNOSIS — E039 Hypothyroidism, unspecified: Secondary | ICD-10-CM

## 2017-07-27 DIAGNOSIS — N979 Female infertility, unspecified: Secondary | ICD-10-CM

## 2017-07-27 NOTE — Telephone Encounter (Signed)
Please advise 

## 2017-07-27 NOTE — Telephone Encounter (Signed)
I already threw those in for today and wednesday

## 2017-07-27 NOTE — Progress Notes (Signed)
Faint positive UPT this weekend instructed to come in for 48-hr HCG's

## 2017-07-27 NOTE — Telephone Encounter (Signed)
Needs appointment for blood draw today and 2/4 and Wednesday 2/6

## 2017-07-27 NOTE — Telephone Encounter (Signed)
Pt had two positive preg tests yesterday, woke up bleeding this am, positive preg test this am, is coming in for thyroid test today, can blood preg test be done as well?  [phone]

## 2017-07-28 LAB — THYROID PANEL WITH TSH
FREE THYROXINE INDEX: 1.3 (ref 1.2–4.9)
T3 UPTAKE RATIO: 22 % — AB (ref 24–39)
T4 TOTAL: 5.8 ug/dL (ref 4.5–12.0)
TSH: 3.17 u[IU]/mL (ref 0.450–4.500)

## 2017-07-28 LAB — BETA HCG QUANT (REF LAB): hCG Quant: 8 m[IU]/mL

## 2017-07-28 NOTE — Telephone Encounter (Signed)
Pt calling again today wanting results of preg test.  She is still bleeding bad.  [phone]

## 2017-07-28 NOTE — Telephone Encounter (Signed)
Please advise 

## 2017-07-29 ENCOUNTER — Other Ambulatory Visit: Payer: Medicaid Other

## 2017-07-29 DIAGNOSIS — N979 Female infertility, unspecified: Secondary | ICD-10-CM

## 2017-07-29 DIAGNOSIS — Z3201 Encounter for pregnancy test, result positive: Secondary | ICD-10-CM

## 2017-07-29 DIAGNOSIS — O3680X0 Pregnancy with inconclusive fetal viability, not applicable or unspecified: Secondary | ICD-10-CM

## 2017-07-30 ENCOUNTER — Other Ambulatory Visit: Payer: Self-pay | Admitting: Obstetrics and Gynecology

## 2017-07-30 ENCOUNTER — Encounter: Payer: Self-pay | Admitting: Obstetrics and Gynecology

## 2017-07-30 DIAGNOSIS — O3680X0 Pregnancy with inconclusive fetal viability, not applicable or unspecified: Secondary | ICD-10-CM

## 2017-07-30 DIAGNOSIS — O209 Hemorrhage in early pregnancy, unspecified: Secondary | ICD-10-CM

## 2017-07-30 LAB — BETA HCG QUANT (REF LAB): HCG QUANT: 15 m[IU]/mL

## 2017-08-05 ENCOUNTER — Other Ambulatory Visit: Payer: Medicaid Other

## 2017-08-05 DIAGNOSIS — O209 Hemorrhage in early pregnancy, unspecified: Secondary | ICD-10-CM

## 2017-08-05 DIAGNOSIS — O3680X0 Pregnancy with inconclusive fetal viability, not applicable or unspecified: Secondary | ICD-10-CM

## 2017-08-06 ENCOUNTER — Telehealth: Payer: Self-pay | Admitting: Obstetrics and Gynecology

## 2017-08-06 ENCOUNTER — Encounter: Payer: Self-pay | Admitting: Obstetrics and Gynecology

## 2017-08-06 ENCOUNTER — Other Ambulatory Visit: Payer: Self-pay | Admitting: Obstetrics and Gynecology

## 2017-08-06 DIAGNOSIS — O3680X0 Pregnancy with inconclusive fetal viability, not applicable or unspecified: Secondary | ICD-10-CM

## 2017-08-06 LAB — BETA HCG QUANT (REF LAB): hCG Quant: 151 m[IU]/mL

## 2017-08-06 NOTE — Telephone Encounter (Signed)
-----   Message from [name], MD sent at 08/06/2017 11:45 AM EST ----- Regarding: Ultrasound If we can get the patient in for NOB and ultrasound 08/14/16 or sometime after that date.  Order in for ultrasound

## 2017-08-06 NOTE — Telephone Encounter (Signed)
Pt is schedule 08/20/17 for u/s and follow up

## 2017-08-16 ENCOUNTER — Emergency Department: Payer: Medicaid Other

## 2017-08-16 ENCOUNTER — Emergency Department
Admission: EM | Admit: 2017-08-16 | Discharge: 2017-08-16 | Disposition: A | Discharge status: Home / Self Care | Payer: Medicaid Other | Attending: Emergency Medicine | Admitting: Emergency Medicine

## 2017-08-16 ENCOUNTER — Encounter: Payer: Self-pay | Admitting: Emergency Medicine

## 2017-08-16 DIAGNOSIS — Z87891 Personal history of nicotine dependence: Secondary | ICD-10-CM | POA: Diagnosis not present

## 2017-08-16 DIAGNOSIS — E039 Hypothyroidism, unspecified: Secondary | ICD-10-CM | POA: Diagnosis not present

## 2017-08-16 DIAGNOSIS — R1030 Lower abdominal pain, unspecified: Secondary | ICD-10-CM | POA: Diagnosis not present

## 2017-08-16 DIAGNOSIS — O99280 Endocrine, nutritional and metabolic diseases complicating pregnancy, unspecified trimester: Secondary | ICD-10-CM | POA: Diagnosis not present

## 2017-08-16 DIAGNOSIS — Z3A01 Less than 8 weeks gestation of pregnancy: Secondary | ICD-10-CM | POA: Diagnosis not present

## 2017-08-16 DIAGNOSIS — Z79899 Other long term (current) drug therapy: Secondary | ICD-10-CM | POA: Insufficient documentation

## 2017-08-16 DIAGNOSIS — O039 Complete or unspecified spontaneous abortion without complication: Secondary | ICD-10-CM | POA: Diagnosis not present

## 2017-08-16 DIAGNOSIS — O209 Hemorrhage in early pregnancy, unspecified: Secondary | ICD-10-CM | POA: Diagnosis present

## 2017-08-16 LAB — ABO/RH: ABO/RH(D): O POS

## 2017-08-16 LAB — HCG, QUANTITATIVE, PREGNANCY: HCG, BETA CHAIN, QUANT, S: 52 m[IU]/mL — AB (ref <5)

## 2017-08-16 NOTE — ED Notes (Signed)
Patient transported to Ultrasound 

## 2017-08-16 NOTE — Discharge Instructions (Signed)
As we discussed please take a pregnancy test in 1 week, if negative no further follow-up required.  If it remains positive for you continue to have heavy bleeding please follow-up with [facility].  If you have significant bleeding feel lightheaded, dizzy or develops significant abdominal discomfort please return to the emergency department for evaluation.

## 2017-08-16 NOTE — ED Notes (Signed)
FN: pt pregnant, unsure of how many weeks and c/o vaginal bleeding.

## 2017-08-16 NOTE — ED Notes (Signed)
Waiting on [place] results. No needs. Husband remains at bedside.

## 2017-08-16 NOTE — ED Provider Notes (Signed)
[facility] Emergency Department Provider Note  Time seen: 11:59 AM  I have reviewed the triage vital signs and the nursing notes.   HISTORY  Chief Complaint Vaginal Bleeding and Abdominal Cramping    HPI Donna Cox is a 27 y.o. female G2 P1 who presents to the emergency department approximately 6-[redacted] weeks pregnant with vaginal bleeding.  According to the patient approxi-1 month ago she found out she is pregnant be a pregnancy test.  Patient saw [facility] last week had blood work to confirm the pregnancy with a beta-hCG of 150 per patient.  Today the patient awoke with vaginal bleeding occasional clot and lower abdominal cramping.  States the bleeding is consistent with a menstrual period.  Describes her cramping is mild to moderate.  Negative review of systems otherwise besides a mild cough.   Past Medical History:  Diagnosis Date  . Bipolar disorder ([place])   . Depression   . Thyroid disease   . Tobacco use     Patient Active Problem List   Diagnosis Date Noted  . Abnormal weight gain 10/10/2016  . Hypothyroid 10/10/2016  . History of vitamin D deficiency 10/10/2016  . Atypical nevi 01/23/2016    History reviewed. No pertinent surgical history.  Prior to Admission medications   Medication Sig Start Date End Date Taking? Authorizing Provider  letrozole [facility]) 2.5 MG tablet Take 2 tablets (5 mg total) daily by mouth. 05/05/17   [name], MD  levothyroxine (SYNTHROID, LEVOTHROID) 75 MCG tablet Take 1 tablet (75 mcg total) by mouth daily. 01/09/17   Kathrine Haddock, NP  medroxyPROGESTERone (PROVERA) 10 MG tablet Take 1 tablet (10 mg total) by mouth daily. 03/16/17 03/26/17  [name], MD    Allergies  Allergen Reactions  . Anette Guarneri  [Lurasidone Hcl]     crying    Family History  Problem Relation Age of Onset  . Mental illness Mother   . Diabetes Mother   . Thyroid disease Mother   . Heart disease Maternal Grandmother   .  Heart disease Maternal Grandfather   . Cancer Maternal Grandfather        lung    Social History Social History   Tobacco Use  . Smoking status: Former Smoker    Types: Cigarettes    Last attempt to quit: 06/23/2014    Years since quitting: 3.1  . Smokeless tobacco: Never Used  Substance Use Topics  . Alcohol use: Yes    Comment: rare occasion  . Drug use: No    Review of Systems Constitutional: Negative for fever. Eyes: Negative for visual complaints ENT: Negative for recent illness/congestion Cardiovascular: Negative for chest pain. Respiratory: Negative for shortness of breath.  Mild cough. Gastrointestinal: Negative for abdominal pain, vomiting and diarrhea. Genitourinary: Positive for vaginal bleeding with clot. Musculoskeletal: Negative for musculoskeletal complaints Skin: Negative for skin complaints  Neurological: Negative for headache All other ROS negative  ____________________________________________   PHYSICAL EXAM:  VITAL SIGNS: ED Triage Vitals [08/16/17 1056]  Enc Vitals Group     BP (!) 116/99     Pulse Rate 98     Resp 20     Temp 98.6 F (37 C)     Temp Source Oral     SpO2 98 %     Weight 210 lb (95.3 kg)     Height 5\' 8"  (1.727 m)     Head Circumference      Peak Flow      Pain Score 2  Pain Loc      Pain Edu?      Excl. in [place]?     Constitutional: Alert and oriented. Well appearing and in no distress. Eyes: Normal exam ENT   Head: Normocephalic and atraumatic.   Mouth/Throat: Mucous membranes are moist. Cardiovascular: Normal rate, regular rhythm. No murmur Respiratory: Normal respiratory effort without tachypnea nor retractions. Breath sounds are clear Gastrointestinal: Soft, mild suprapubic tenderness palpation.  No rebound or guarding.  No distention. Musculoskeletal: Nontender with normal range of motion in all extremities Neurologic:  Normal speech and language. No gross focal neurologic deficits  Skin:  Skin is warm,  dry and intact.  Psychiatric: Mood and affect are normal.  ____________________________________________   RADIOLOGY  [place] consistent with miscarriage  ____________________________________________   INITIAL IMPRESSION / ASSESSMENT AND PLAN / ED COURSE  Pertinent labs & imaging results that were available during my care of the patient were reviewed by me and considered in my medical decision making (see chart for details).  Patient presents to the emergency department for vaginal bleeding approximately [redacted] weeks pregnant.  Differential would include threatened miscarriage, subchorionic hemorrhage, miscarriage.  Patient has been seen by [facility], with reported beta hCG 150 last week, making this more consistent with a miscarriage.  We will discussed with [facility], obtain an ultrasound to further evaluate.  Patient's beta-hCG is 52 today.  No ABO/Rh on file we will draw a vial or discussed with [facility].  Discussed the patient with [facility].  They state on the 13th the patient had a beta-hCG of 150.  We will obtain the ultrasound although this is likely a miscarriage.  Patient's ABO Rh is O+, no need for RhoGam.  Ultrasound consistent with miscarriage.  Patient will follow up with [facility].  I discussed return precautions.   ____________________________________________   FINAL CLINICAL IMPRESSION(S) / ED DIAGNOSES  Miscarriage    Harvest Dark, MD 08/16/17 1345

## 2017-08-16 NOTE — ED Triage Notes (Signed)
Pt reports found out she was pregnant a month ago. Reports has had lab work done and has an appointment scheduled for Thursday but today she started with bleeding and a lot of cramping. Pt reports blood was a large amount in nature and has some clots. Pt is being seen by Georgianne Fick at [facility].

## 2017-08-17 ENCOUNTER — Encounter: Payer: Self-pay | Admitting: Obstetrics and Gynecology

## 2017-08-17 ENCOUNTER — Telehealth: Payer: Self-pay | Admitting: Obstetrics and Gynecology

## 2017-08-17 NOTE — Telephone Encounter (Signed)
Per AMS, schedule her at his next opening anytime this week. She will not need surgery.   ----- Message -----  From: Betsy Pries  Sent: 08/17/2017  7:16 AM  To: [facility]  Subject: Visit Follow-Up Question               ----- Message from [place], Generic sent at 08/17/2017 7:16 AM EST -----    The ER wanted me to follow up with you to get more blood work, so we can confirm miscarriage.   Per AMS to cancel ultrasound but to keep ER Follow up as schedule 08/17/17 do to patient already having u/s at ER. Left voicemail for patient to call back to confirm

## 2017-08-19 ENCOUNTER — Emergency Department: Payer: Medicaid Other

## 2017-08-19 ENCOUNTER — Other Ambulatory Visit: Payer: Self-pay

## 2017-08-19 ENCOUNTER — Emergency Department
Admission: EM | Admit: 2017-08-19 | Discharge: 2017-08-19 | Disposition: A | Discharge status: Home / Self Care | Payer: Medicaid Other | Attending: Emergency Medicine | Admitting: Emergency Medicine

## 2017-08-19 DIAGNOSIS — F17210 Nicotine dependence, cigarettes, uncomplicated: Secondary | ICD-10-CM | POA: Diagnosis not present

## 2017-08-19 DIAGNOSIS — N23 Unspecified renal colic: Secondary | ICD-10-CM

## 2017-08-19 DIAGNOSIS — E039 Hypothyroidism, unspecified: Secondary | ICD-10-CM | POA: Insufficient documentation

## 2017-08-19 DIAGNOSIS — O26899 Other specified pregnancy related conditions, unspecified trimester: Secondary | ICD-10-CM

## 2017-08-19 DIAGNOSIS — O99280 Endocrine, nutritional and metabolic diseases complicating pregnancy, unspecified trimester: Secondary | ICD-10-CM | POA: Diagnosis not present

## 2017-08-19 DIAGNOSIS — O99331 Smoking (tobacco) complicating pregnancy, first trimester: Secondary | ICD-10-CM | POA: Insufficient documentation

## 2017-08-19 DIAGNOSIS — O039 Complete or unspecified spontaneous abortion without complication: Secondary | ICD-10-CM

## 2017-08-19 DIAGNOSIS — R102 Pelvic and perineal pain: Secondary | ICD-10-CM

## 2017-08-19 DIAGNOSIS — Z3A01 Less than 8 weeks gestation of pregnancy: Secondary | ICD-10-CM | POA: Insufficient documentation

## 2017-08-19 DIAGNOSIS — O9989 Other specified diseases and conditions complicating pregnancy, childbirth and the puerperium: Secondary | ICD-10-CM | POA: Diagnosis present

## 2017-08-19 DIAGNOSIS — R1032 Left lower quadrant pain: Secondary | ICD-10-CM

## 2017-08-19 LAB — CBC
HEMATOCRIT: 42.3 % (ref 35.0–47.0)
HEMOGLOBIN: 14.1 g/dL (ref 12.0–16.0)
MCH: 28 pg (ref 26.0–34.0)
MCHC: 33.4 g/dL (ref 32.0–36.0)
MCV: 83.9 fL (ref 80.0–100.0)
Platelets: 261 10*3/uL (ref 150–440)
RBC: 5.05 MIL/uL (ref 3.80–5.20)
RDW: 14.2 % (ref 11.5–14.5)
WBC: 9.7 10*3/uL (ref 3.6–11.0)

## 2017-08-19 LAB — URINALYSIS, COMPLETE (UACMP) WITH MICROSCOPIC
Bilirubin Urine: NEGATIVE
Glucose, UA: NEGATIVE mg/dL
KETONES UR: NEGATIVE mg/dL
Nitrite: NEGATIVE
PROTEIN: NEGATIVE mg/dL
Specific Gravity, Urine: 1.018 (ref 1.005–1.030)
pH: 6 (ref 5.0–8.0)

## 2017-08-19 LAB — COMPREHENSIVE METABOLIC PANEL
ALBUMIN: 4 g/dL (ref 3.5–5.0)
ALT: 39 U/L (ref 14–54)
ANION GAP: 9 (ref 5–15)
AST: 31 U/L (ref 15–41)
Alkaline Phosphatase: 76 U/L (ref 38–126)
BUN: 14 mg/dL (ref 6–20)
CHLORIDE: 100 mmol/L — AB (ref 101–111)
CO2: 26 mmol/L (ref 22–32)
Calcium: 9.3 mg/dL (ref 8.9–10.3)
Creatinine, Ser: 0.73 mg/dL (ref 0.44–1.00)
GFR calc Af Amer: >60 mL/min (ref >60)
GFR calc non Af Amer: >60 mL/min (ref >60)
GLUCOSE: 96 mg/dL (ref 65–99)
POTASSIUM: 3.9 mmol/L (ref 3.5–5.1)
Sodium: 135 mmol/L (ref 135–145)
Total Bilirubin: 0.2 mg/dL — ABNORMAL LOW (ref 0.3–1.2)
Total Protein: 7.4 g/dL (ref 6.5–8.1)

## 2017-08-19 LAB — HCG, QUANTITATIVE, PREGNANCY: HCG, BETA CHAIN, QUANT, S: 11 m[IU]/mL — AB (ref <5)

## 2017-08-19 NOTE — ED Provider Notes (Addendum)
[facility] Emergency Department Provider Note       Time seen: ----------------------------------------- 1:01 PM on 08/19/2017 -----------------------------------------   I have reviewed the triage vital signs and the nursing notes.  HISTORY   Chief Complaint Pelvic Pain ([redacted] weeks pregnant)    HPI Roxy Mastandrea is a 27 y.o. female with a history of bipolar disorder, depression, thyroid disease and tobacco abuse who presents to the ED for pelvic pain.  She is having sharp and stabbing left lower quadrant pain.  She was seen recently for possible miscarriage.  She was thinking several days ago that she was [redacted] weeks pregnant.  Yesterday she noticed left lower pelvic pain that was worsening, denies any more vaginal bleeding.  Past Medical History:  Diagnosis Date  . Bipolar disorder ([place])   . Depression   . Thyroid disease   . Tobacco use     Patient Active Problem List   Diagnosis Date Noted  . Abnormal weight gain 10/10/2016  . Hypothyroid 10/10/2016  . History of vitamin D deficiency 10/10/2016  . Atypical nevi 01/23/2016    History reviewed. No pertinent surgical history.  Allergies Latuda  [lurasidone hcl]  Social History Social History   Tobacco Use  . Smoking status: Former Smoker    Types: Cigarettes    Last attempt to quit: 06/23/2014    Years since quitting: 3.1  . Smokeless tobacco: Never Used  Substance Use Topics  . Alcohol use: Yes    Comment: rare occasion  . Drug use: No    Review of Systems Constitutional: Negative for fever. Cardiovascular: Negative for chest pain. Respiratory: Negative for shortness of breath. Gastrointestinal: Negative for abdominal pain, vomiting and diarrhea. Genitourinary: Positive for pelvic pain Musculoskeletal: Negative for back pain. Skin: Negative for rash. Neurological: Negative for headaches, focal weakness or numbness.  All systems negative/normal/unremarkable except as stated in the  HPI  ____________________________________________   PHYSICAL EXAM:  VITAL SIGNS: ED Triage Vitals  Enc Vitals Group     BP 08/19/17 1125 (!) 142/82     Pulse Rate 08/19/17 1125 97     Resp 08/19/17 1125 16     Temp 08/19/17 1125 99.3 F (37.4 C)     Temp Source 08/19/17 1125 Oral     SpO2 08/19/17 1125 98 %     Weight 08/19/17 1125 210 lb (95.3 kg)     Height 08/19/17 1125 5\' 8"  (1.727 m)     Head Circumference --      Peak Flow --      Pain Score 08/19/17 1135 7     Pain Loc --      Pain Edu? --      Excl. in [place]? --     Constitutional: Alert and oriented. Well appearing and in no distress. Eyes: Conjunctivae are normal. Normal extraocular movements. Cardiovascular: Normal rate, regular rhythm. No murmurs, rubs, or gallops. Respiratory: Normal respiratory effort without tachypnea nor retractions. Breath sounds are clear and equal bilaterally. No wheezes/rales/rhonchi. Gastrointestinal: Left lower quadrant tenderness, no rebound or guarding.  Normal bowel sounds. Musculoskeletal: Nontender with normal range of motion in extremities. No lower extremity tenderness nor edema. Neurologic:  Normal speech and language. No gross focal neurologic deficits are appreciated.  Skin:  Skin is warm, dry and intact. No rash noted. Psychiatric: Mood and affect are normal. Speech and behavior are normal.  ____________________________________________  ED COURSE:  As part of my medical decision making, I reviewed the following data within the electronic  MEDICAL RECORD NUMBER History obtained from family if available, nursing notes, old chart and ekg, as well as notes from prior ED visits. Patient presented for pelvic pain and pregnancy, we will assess with labs and imaging as indicated at this time.   Procedures ____________________________________________   LABS (pertinent positives/negatives)  Labs Reviewed  HCG, QUANTITATIVE, PREGNANCY - Abnormal; Notable for the following components:       Result Value   hCG, Beta Chain, Quant, S 11 (*)    All other components within normal limits  COMPREHENSIVE METABOLIC PANEL - Abnormal; Notable for the following components:   Chloride 100 (*)    Total Bilirubin 0.2 (*)    All other components within normal limits  URINALYSIS, COMPLETE (UACMP) WITH MICROSCOPIC - Abnormal; Notable for the following components:   Color, Urine YELLOW (*)    APPearance CLEAR (*)    Hgb urine dipstick MODERATE (*)    Leukocytes, UA SMALL (*)    Bacteria, UA RARE (*)    Squamous Epithelial / LPF 0-5 (*)    All other components within normal limits  CBC    RADIOLOGY Images were viewed by me  Pelvic ultrasound IMPRESSION: As on previous exam, no intrauterine gestation identified.  This can be seen with spontaneous abortion, ectopic pregnancy and with early intrauterine pregnancy too early to visualize, recommend correlation with serial quantitative beta HCG.  Normal sonographic appearance of the ovaries without evidence of ovarian torsion. ____________________________________________  DIFFERENTIAL DIAGNOSIS   Miscarriage, ectopic, ovarian torsion, ruptured ovarian cyst, gas pain, constipation, renal colic, UTI  FINAL ASSESSMENT AND PLAN  Miscarriage, renal colic   Plan: Patient had presented for pelvic pain with recent vaginal bleeding. Patient's labs do reveal a decreasing hCG level consistent with miscarriage. Patient's imaging did not reveal any acute process.  Patient is having sharp left flank pain and does have hematuria.  This is likely coming from a kidney stone.  Patient is declining any further pain medicine or treatment at this time.  I have advised Motrin to take for pain.  She is stable for outpatient follow-up.   Laurence Aly, MD   Note: This note was generated in part or whole with voice recognition software. Voice recognition is usually quite accurate but there are transcription errors that can and very often do  occur. I apologize for any typographical errors that were not detected and corrected.     Earleen Newport, MD 08/19/17 1440    Earleen Newport, MD 08/19/17 [phone]

## 2017-08-19 NOTE — ED Triage Notes (Signed)
Pt states that here Sunday for spotting, around [redacted] weeks pregnant. Was told that she could be miscarrying d/t HCG levels being so decreased. Yesterday noticed L sided pelvic pain and today worse. Denies any more vaginal bleeding (states "I only bled for about 30 minutes). Alert, oriented.

## 2017-08-20 ENCOUNTER — Ambulatory Visit (INDEPENDENT_AMBULATORY_CARE_PROVIDER_SITE_OTHER): Payer: Medicaid Other | Admitting: Obstetrics and Gynecology

## 2017-08-20 ENCOUNTER — Encounter: Payer: Self-pay | Admitting: Obstetrics and Gynecology

## 2017-08-20 ENCOUNTER — Other Ambulatory Visit: Payer: Medicaid Other

## 2017-08-20 ENCOUNTER — Ambulatory Visit: Payer: Medicaid Other | Admitting: Obstetrics and Gynecology

## 2017-08-20 VITALS — BP 108/64 | HR 98 | Ht 68.0 in | Wt 247.0 lb

## 2017-08-20 DIAGNOSIS — O034 Incomplete spontaneous abortion without complication: Secondary | ICD-10-CM

## 2017-08-20 MED ORDER — LEVOTHYROXINE SODIUM 75 MCG PO TABS: 75.0000 ug | ORAL_TABLET | Freq: Every day | ORAL | 0 refills | Status: DC

## 2017-08-20 NOTE — Progress Notes (Signed)
Obstetric Problem Visit    Chief Complaint:  Chief Complaint  Patient presents with  . Follow-up    History of Present Illness: Patient is a 27 y.o. G2P1001 Unknown presenting for first trimester bleeding.  The onset of bleeding was 1 week ago.  Is bleeding equal to or greater than normal menstrual flow:  No Any recent trauma:  No Recent intercourse:  No History of prior miscarriage:  No Prior ultrasound demonstrating IUP: none.  Prior ultrasound demonstrating viable IUP:  No Prior Serum HCG:  Yes down trending 11 mIU/mL 07/19/17 Rh status: O POS Performed at [facility], [address] [place], [place] [address]   Review of Systems: Review of Systems  Constitutional: Negative for chills and fever.  Gastrointestinal: Negative for abdominal pain and vomiting.    Past Medical History:  Past Medical History:  Diagnosis Date  . Bipolar disorder ([place])   . Depression   . Thyroid disease   . Tobacco use     Past Surgical History:  No past surgical history on file.  Obstetric History: G2P1001  Family History:  Family History  Problem Relation Age of Onset  . Mental illness Mother   . Diabetes Mother   . Thyroid disease Mother   . Heart disease Maternal Grandmother   . Heart disease Maternal Grandfather   . Cancer Maternal Grandfather        lung    Social History:  Social History   Socioeconomic History  . Marital status: Single    Spouse name: Not on file  . Number of children: Not on file  . Years of education: Not on file  . Highest education level: Not on file  Social Needs  . Financial resource strain: Not on file  . Food insecurity - worry: Not on file  . Food insecurity - inability: Not on file  . Transportation needs - medical: Not on file  . Transportation needs - non-medical: Not on file  Occupational History  . Not on file  Tobacco Use  . Smoking status: Former Smoker    Types: Cigarettes    Last attempt to quit: 06/23/2014   Years since quitting: 3.1  . Smokeless tobacco: Never Used  Substance and Sexual Activity  . Alcohol use: Yes    Comment: rare occasion  . Drug use: No  . Sexual activity: Yes  Other Topics Concern  . Not on file  Social History Narrative  . Not on file    Allergies:  Allergies  Allergen Reactions  . Anette Guarneri  [Lurasidone Hcl]     crying    Medications: Prior to Admission medications   Medication Sig Start Date End Date Taking? Authorizing Provider  letrozole (FEMARA) 2.5 MG tablet Take 2 tablets (5 mg total) daily by mouth. Patient not taking: Reported on 08/16/2017 05/05/17   [name], MD  levothyroxine (SYNTHROID, LEVOTHROID) 75 MCG tablet Take 1 tablet (75 mcg total) by mouth daily. 08/20/17   [name], MD  medroxyPROGESTERone (PROVERA) 10 MG tablet Take 1 tablet (10 mg total) by mouth daily. 03/16/17 03/26/17  [name], MD    Physical Exam Vitals: Blood pressure 108/64, pulse 98, height 5\' 8"  (1.727 m), weight 247 lb (112 kg), last menstrual period 05/31/2017. General: NAD HEENT: normocephalic, anicteric Pulmonary: No increased work of breathing, Extremities: no edema, erythema, or tenderness Neurologic: Grossly intact Psychiatric: mood appropriate, tearful at times, affect full  Assessment: 27 y.o. G2P1001 with likely complete 1st trimester SAB  Plan:  Problem List Items Addressed This Visit    None    Visit Diagnoses    Incomplete abortion    -  Primary   Relevant Orders   B-HCG Quant      1) First trimester bleeding - incidence and clinical course of first trimester bleeding is discussed in detail with the patient today.  Approximately 1/3 of pregnancies ending in live births experienced 1st trimester bleeding.  The amount of bleeding is variable and not necessarily predictive of outcome.  Sources may be cervical or uterine.  Subchorionic hemorrhages are a frequent concurrent findings on ultrasound and are followed expectantly.  These  often absorb or regress spontaneously although risk for expansion and further disruption of the utero-placental interface leading to miscarriage is possible.  There is no clearly documented benefit to limiting or modifying activity and sexual intercourse in altering clinic course of 1st trimester bleeding.    2) Given down trending HCG suspect complete vs incomplete SAB, although given no IUP visualized at any time ectopic remains in the differential.  Will repeat HCG in 1 week.  If no menses by first week of April provera course  3) The patient is Rh postive, rhogam is therefore not indicated to decrease the risk rhesus alloimmunization.    4) Routine bleeding precautions were discussed with the patient prior the conclusion of today's visit.  5) A total of 15 minutes were spent in face-to-face contact with the patient during this encounter with over half of that time devoted to counseling and coordination of care.  6) Return in about 1 week (around 08/27/2017) for lab test 1 week.    [name], MD, [facility], [facility] 08/20/2017, 9:37 PM

## 2017-10-06 ENCOUNTER — Other Ambulatory Visit: Payer: Self-pay | Admitting: Obstetrics and Gynecology

## 2017-10-06 ENCOUNTER — Encounter: Payer: Self-pay | Admitting: Obstetrics and Gynecology

## 2017-10-06 MED ORDER — MEDROXYPROGESTERONE ACETATE 10 MG PO TABS: 10.0000 mg | ORAL_TABLET | Freq: Every day | ORAL | 0 refills | Status: DC

## 2017-12-14 NOTE — Progress Notes (Signed)
cancel

## 2017-12-28 ENCOUNTER — Other Ambulatory Visit: Payer: Self-pay

## 2017-12-28 ENCOUNTER — Encounter: Payer: Self-pay | Admitting: Family Medicine

## 2017-12-28 ENCOUNTER — Ambulatory Visit (INDEPENDENT_AMBULATORY_CARE_PROVIDER_SITE_OTHER): Payer: Medicaid Other | Admitting: Family Medicine

## 2017-12-28 VITALS — BP 130/80 | HR 87 | Temp 98.1°F | Wt 228.3 lb

## 2017-12-28 DIAGNOSIS — Z3200 Encounter for pregnancy test, result unknown: Secondary | ICD-10-CM

## 2017-12-28 LAB — PREGNANCY, URINE: Preg Test, Ur: NEGATIVE

## 2017-12-28 NOTE — Progress Notes (Signed)
   BP 130/80   Pulse 87   Temp 98.1 F (36.7 C) (Oral)   Wt 228 lb 4.8 oz (103.6 kg)   LMP 05/31/2017   SpO2 99%   BMI 34.71 kg/m    Subjective:    Patient ID: Donna Cox, female    DOB: 07-Aug-1990, 27 y.o.   MRN: [id number]  HPI: Donna Cox is a 27 y.o. female  Chief Complaint  Patient presents with  . Abdominal Pain    lower abd/ pt requests [place]  . Positive prregnancy test   Pt here today for pregnancy confirmation. LMP - around the first week of June following a provera pack. Taking femara given by GYN as well.  Two positive home pregnancy tests in the last 24 hours. Some light spotting this morning with heavy bleeding for about 10 min that then stopped. No cramping with the bleeding. Has had a recent miscarriage. Denies N/V/D, fevers, vaginal discharge, uncontrolled vaginal bleeding/large clots.   Relevant past medical, surgical, family and social history reviewed and updated as indicated. Interim medical history since our last visit reviewed. Allergies and medications reviewed and updated.  Review of Systems  Per HPI unless specifically indicated above     Objective:    BP 130/80   Pulse 87   Temp 98.1 F (36.7 C) (Oral)   Wt 228 lb 4.8 oz (103.6 kg)   LMP 05/31/2017   SpO2 99%   BMI 34.71 kg/m   Wt Readings from Last 3 Encounters:  12/28/17 228 lb 4.8 oz (103.6 kg)  08/20/17 247 lb (112 kg)  08/19/17 210 lb (95.3 kg)    Physical Exam  Constitutional: She is oriented to person, place, and time. She appears well-developed and well-nourished. No distress.  HENT:  Head: Atraumatic.  Eyes: Pupils are equal, round, and reactive to light. Conjunctivae are normal.  Neck: Normal range of motion. Neck supple.  Cardiovascular: Normal rate and regular rhythm.  Pulmonary/Chest: Effort normal and breath sounds normal.  Musculoskeletal: Normal range of motion.  Neurological: She is alert and oriented to person, place, and time.  Skin: Skin is warm and dry.    Psychiatric: She has a normal mood and affect. Her behavior is normal.  Nursing note and vitals reviewed.   Results for orders placed or performed in visit on 12/28/17  Pregnancy, urine  Result Value Ref Range   Preg Test, Ur Negative Negative  Beta hCG quant (ref lab)  Result Value Ref Range   hCG Quant <1 mIU/mL      Assessment & Plan:   Problem List Items Addressed This Visit    None    Visit Diagnoses    Possible pregnancy, not yet confirmed    -  Primary   Urine hcg neg, will await serum levels. F/u with GYN for further management   Relevant Orders   Pregnancy, urine (Completed)   Beta hCG quant (ref lab) (Completed)       Follow up plan: Return for GYN.

## 2017-12-29 ENCOUNTER — Telehealth: Payer: Self-pay | Admitting: Family Medicine

## 2017-12-29 LAB — BETA HCG QUANT (REF LAB): hCG Quant: <1 m[IU]/mL

## 2017-12-29 NOTE — Telephone Encounter (Signed)
Called pt about neg hcg, advised her to f/u with GYN for further management

## 2017-12-29 NOTE — Patient Instructions (Signed)
Follow-up with GYN

## 2017-12-30 ENCOUNTER — Encounter: Payer: Self-pay | Admitting: Obstetrics and Gynecology

## 2018-01-05 ENCOUNTER — Other Ambulatory Visit: Payer: Self-pay | Admitting: Obstetrics and Gynecology

## 2018-01-05 MED ORDER — MEDROXYPROGESTERONE ACETATE 10 MG PO TABS: 10.0000 mg | ORAL_TABLET | Freq: Every day | ORAL | 0 refills | Status: DC

## 2018-01-06 ENCOUNTER — Ambulatory Visit: Payer: Medicaid Other | Admitting: Obstetrics and Gynecology

## 2018-01-18 ENCOUNTER — Other Ambulatory Visit: Payer: Self-pay | Admitting: Obstetrics and Gynecology

## 2018-01-18 ENCOUNTER — Encounter: Payer: Self-pay | Admitting: Obstetrics and Gynecology

## 2018-01-18 DIAGNOSIS — N970 Female infertility associated with anovulation: Secondary | ICD-10-CM

## 2018-01-18 MED ORDER — LETROZOLE 2.5 MG PO TABS: 7.5000 mg | ORAL_TABLET | Freq: Every day | ORAL | 0 refills | Status: DC

## 2018-01-18 NOTE — Progress Notes (Signed)
LMP 01/17/18 restart cycle I letrozole 7.5mg  day 21 progesterone on 8/19

## 2018-01-18 NOTE — Telephone Encounter (Signed)
Day 21 progesterone lab on 8/19

## 2018-02-08 ENCOUNTER — Other Ambulatory Visit: Payer: Medicaid Other

## 2018-02-08 DIAGNOSIS — N970 Female infertility associated with anovulation: Secondary | ICD-10-CM

## 2018-02-09 LAB — PROGESTERONE: PROGESTERONE: 0.4 ng/mL

## 2018-02-10 ENCOUNTER — Other Ambulatory Visit: Payer: Self-pay | Admitting: Obstetrics and Gynecology

## 2018-02-10 MED ORDER — METFORMIN HCL 500 MG PO TABS: 500.0000 mg | ORAL_TABLET | Freq: Two times a day (BID) | ORAL | 11 refills | Status: DC

## 2018-02-26 ENCOUNTER — Ambulatory Visit: Payer: Self-pay

## 2018-02-26 NOTE — Telephone Encounter (Signed)
Patient called in with c/o "cough, chest pain." She says "less than 12 hours ago, I developed a cough, chest congestion and a fever of 102.3 last night. I coughed up dark green sputum this morning and last night it was yellow. My chest hurts when I take a deep breath or cough. I didn't cough up blood." I asked about other symptoms, she says "wheezing and a runny nose." According to protocol, see PCP within 24 hours, offered patient appointment today at 1600 with PCP, she says "is there someone else who can see me sooner, because I will need a work note. I have to be at work at 3 pm." I advised no other availability today, she says "I will try to go to one of the walk in clinics, thank you." Patient hung up the phone before care advice could be given.  Reason for Disposition . [1] Continuous (nonstop) coughing interferes with work or school AND [2] no improvement using cough treatment per Care Advice  Answer Assessment - Initial Assessment Questions 1. ONSET: "When did the cough begin?"      Less than 12 hours ago 2. SEVERITY: "How bad is the cough today?"      Really bad 3. RESPIRATORY DISTRESS: "Describe your breathing."      Feels hard to breathe 4. FEVER: "Do you have a fever?" If so, ask: "What is your temperature, how was it measured, and when did it start?"     Last night it was 102.3 5. SPUTUM: "Describe the color of your sputum" (clear, white, yellow, green)     Dark green this morning, yellow last night 6. HEMOPTYSIS: "Are you coughing up any blood?" If so ask: "How much?" (flecks, streaks, tablespoons, etc.)     No 7. CARDIAC HISTORY: "Do you have any history of heart disease?" (e.g., heart attack, congestive heart failure)      No 8. LUNG HISTORY: "Do you have any history of lung disease?"  (e.g., pulmonary embolus, asthma, emphysema)     Bronchitis 9. PE RISK FACTORS: "Do you have a history of blood clots?" (or: recent major surgery, recent prolonged travel, bedridden)     No 10.  OTHER SYMPTOMS: "Do you have any other symptoms?" (e.g., runny nose, wheezing, chest pain)       Runny nose, wheezing, chest pain when take deep breath or cough 11. PREGNANCY: "Is there any chance you are pregnant?" "When was your last menstrual period?"       No, LMP last month 12. TRAVEL: "Have you traveled out of the country in the last month?" (e.g., travel history, exposures)       No  Protocols used: [place]

## 2018-02-26 NOTE — Telephone Encounter (Signed)
Not sure if you were going to force ovulation for her since you mentioned sending her to reproductive endocrinology. So, i'm sending this to you for further decision making.

## 2018-03-01 ENCOUNTER — Other Ambulatory Visit: Payer: Self-pay | Admitting: Obstetrics and Gynecology

## 2018-03-01 MED ORDER — MEDROXYPROGESTERONE ACETATE 10 MG PO TABS: 10.0000 mg | ORAL_TABLET | Freq: Every day | ORAL | 0 refills | Status: DC

## 2018-04-26 ENCOUNTER — Other Ambulatory Visit: Payer: Self-pay | Admitting: Obstetrics and Gynecology

## 2018-04-26 MED ORDER — LEVOTHYROXINE SODIUM 75 MCG PO TABS: 75.0000 ug | ORAL_TABLET | Freq: Every day | ORAL | 3 refills | Status: DC

## 2018-05-27 ENCOUNTER — Other Ambulatory Visit: Payer: Self-pay

## 2018-05-27 ENCOUNTER — Ambulatory Visit: Payer: Medicaid Other | Admitting: Family Medicine

## 2018-05-27 ENCOUNTER — Ambulatory Visit: Payer: Self-pay | Admitting: *Deleted

## 2018-05-27 ENCOUNTER — Encounter: Payer: Self-pay | Admitting: Family Medicine

## 2018-05-27 VITALS — BP 118/73 | HR 78 | Temp 98.7°F | Ht 68.0 in | Wt 214.0 lb

## 2018-05-27 DIAGNOSIS — J22 Unspecified acute lower respiratory infection: Secondary | ICD-10-CM | POA: Diagnosis not present

## 2018-05-27 MED ORDER — BUDESONIDE-FORMOTEROL FUMARATE 160-4.5 MCG/ACT IN AERO
2.0000 | INHALATION_SPRAY | Freq: Two times a day (BID) | RESPIRATORY_TRACT | 0 refills | Status: DC
Start: 2018-05-27 — End: 2018-10-27

## 2018-05-27 MED ORDER — AZITHROMYCIN 250 MG PO TABS: ORAL_TABLET | ORAL | 0 refills | Status: DC

## 2018-05-27 MED ORDER — HYDROCOD POLST-CPM POLST ER 10-8 MG/5ML PO SUER: 5.0000 mL | Freq: Two times a day (BID) | ORAL | 0 refills | Status: DC | PRN

## 2018-05-27 NOTE — Progress Notes (Addendum)
   BP 118/73   Pulse 78   Temp 98.7 F (37.1 C) (Oral)   Ht 5\' 8"  (1.727 m)   Wt 214 lb (97.1 kg)   LMP 05/31/2017   SpO2 96%   Breastfeeding Unknown   BMI 32.54 kg/m    Subjective:    Patient ID: Donna Cox, female    DOB: 09-07-1990, 27 y.o.   MRN: [id number]  HPI: Donna Cox is a 27 y.o. female  Chief Complaint  Patient presents with  . Cough    since Sunday per patient/ pt states went to [facility] ER about 2 weeks ago   Here today for significant productive hacking cough, chest tightness, and fatigue x 5 days. Was diagnosed at the ER with the flu almost 2 weeks ago and was improving from that when this came on. states she's coughing so hard she's vomiting and urinating on herself. Trying many OTC remedies without any relief. Denies current fevers, chills, aches, abdominal pain. Multiple family members also sick with similar sxs.   Relevant past medical, surgical, family and social history reviewed and updated as indicated. Interim medical history since our last visit reviewed. Allergies and medications reviewed and updated.  Review of Systems  Per HPI unless specifically indicated above     Objective:    BP 118/73   Pulse 78   Temp 98.7 F (37.1 C) (Oral)   Ht 5\' 8"  (1.727 m)   Wt 214 lb (97.1 kg)   LMP 05/31/2017   SpO2 96%   Breastfeeding Unknown   BMI 32.54 kg/m   Wt Readings from Last 3 Encounters:  05/27/18 214 lb (97.1 kg)  12/28/17 228 lb 4.8 oz (103.6 kg)  08/20/17 247 lb (112 kg)    Physical Exam  Constitutional: She is oriented to person, place, and time. She appears well-developed and well-nourished. No distress.  HENT:  Head: Atraumatic.  Nasal mucosa and oropharynx erythematous and mildly edematous, thick discharge present  Eyes: Conjunctivae and EOM are normal.  Neck: Normal range of motion. Neck supple.  Cardiovascular: Normal rate, regular rhythm and normal heart sounds.  Pulmonary/Chest: Effort normal. No respiratory distress. She has  wheezes. She has no rales. She exhibits no tenderness.  Musculoskeletal: Normal range of motion.  Lymphadenopathy:    She has no cervical adenopathy.  Neurological: She is alert and oriented to person, place, and time.  Skin: Skin is warm and dry. No rash noted.  Psychiatric: She has a normal mood and affect. Her behavior is normal. Thought content normal.  Nursing note and vitals reviewed.   Results for orders placed or performed in visit on 02/08/18  Progesterone  Result Value Ref Range   Progesterone 0.4 ng/mL      Assessment & Plan:   Problem List Items Addressed This Visit    None    Visit Diagnoses    Lower respiratory infection    -  Primary   Tx with zpak, tussionex at bedtime, and symbicort inhaler BID x several weeks. Supportive care and OTC remedies reviewed. CXR if not improving by Mon.   Relevant Medications   azithromycin (ZITHROMAX) 250 MG tablet       Follow up plan: Return if symptoms worsen or fail to improve.

## 2018-05-27 NOTE — Telephone Encounter (Signed)
Pt called with complaints of beng diagnosed wit the flu last week; she says that her cough has turned into a cough a barking cough, and she vomited after coughing; she is having episodes of incontinence with cough; nurse triage initiated and recommendations made per; the pt normally sees Merrie Roof; this provider has no availability today; per Santiago Glad at [place], their computers are down; the pt would like to be seen today; informed pt that unable to schedule today at this point; will call her back when situation rectified.    Reason for Disposition . SEVERE coughing spells (e.g., whooping sound after coughing, vomiting after coughing)  Answer Assessment - Initial Assessment Questions 1. ONSET: "When did the cough begin?"      05/23/18 2. SEVERITY: "How bad is the cough today?"      severe 3. RESPIRATORY DISTRESS: "Describe your breathing."      Occasional chest tightness 4. FEVER: "Do you have a fever?" If so, ask: "What is your temperature, how was it measured, and when did it start?"     no 5. SPUTUM: "Describe the color of your sputum" (clear, white, yellow, green)     thick dark yellow 6. HEMOPTYSIS: "Are you coughing up any blood?" If so ask: "How much?" (flecks, streaks, tablespoons, etc.)     Yes on streaks of blood on12/3/19 but none since then 7. CARDIAC HISTORY: "Do you have any history of heart disease?" (e.g., heart attack, congestive heart failure)      no 8. LUNG HISTORY: "Do you have any history of lung disease?"  (e.g., pulmonary embolus, asthma, emphysema)     no 9. PE RISK FACTORS: "Do you have a history of blood clots?" (or: recent major surgery, recent prolonged travel, bedridden)     no 10. OTHER SYMPTOMS: "Do you have any other symptoms?" (e.g., runny nose, wheezing, chest pain)       Runny nose, incontinence  & vomiting with cough, wheezing intermittently but mainly with cough  11. PREGNANCY: "Is there any chance you are pregnant?" "When was your last menstrual  period?"       Yes LMP 11/11/ 2019 12. TRAVEL: "Have you traveled out of the country in the last month?" (e.g., travel history, exposures)       no  Protocols used: [place]

## 2018-05-27 NOTE — Telephone Encounter (Signed)
Spoke with Sharalyn Ink at [facility] regarding scheduling the pt; per Sharalyn Ink, pt offered and accepted appointment with Merrie Roof at 1345 at 1345; pt verbalized understanding.

## 2018-07-06 ENCOUNTER — Ambulatory Visit: Payer: Self-pay

## 2018-07-06 NOTE — Telephone Encounter (Signed)
Pt. Reports she felt dizzy this morning when she got up. Reports she has vomited x 5 today. Went to work and "almost passed out." EMS was called. Blood sugar was 150 and BP "was normal." Also reports she could be pregnant. No availability today. Pt. States she checked with 2 urgent care facilities and they told her she would have to go to ED. Pt. States she "feels better and I can wait until tomorrow." Appointment made for tomorrow. Instructed if symptoms worsen to go to ED. Reason for Disposition . [1] MODERATE dizziness (e.g., interferes with normal activities) AND [2] has NOT been evaluated by physician for this  (Exception: dizziness caused by heat exposure, sudden standing, or poor fluid intake)  Answer Assessment - Initial Assessment Questions 1. DESCRIPTION: "Describe your dizziness."     Dizzy 2. LIGHTHEADED: "Do you feel lightheaded?" (e.g., somewhat faint, woozy, weak upon standing)     Woozy 3. VERTIGO: "Do you feel like either you or the room is spinning or tilting?" (i.e. vertigo)     No 4. SEVERITY: "How bad is it?"  "Do you feel like you are going to faint?" "Can you stand and walk?"   - MILD - walking normally   - MODERATE - interferes with normal activities (e.g., work, school)    - SEVERE - unable to stand, requires support to walk, feels like passing out now.      Almost fainted 5. ONSET:  "When did the dizziness begin?"     This morning 6. AGGRAVATING FACTORS: "Does anything make it worse?" (e.g., standing, change in head position)     Worse with standing 7. HEART RATE: "Can you tell me your heart rate?" "How many beats in 15 seconds?"  (Note: not all patients can do this)       EMT said BP was good 8. CAUSE: "What do you think is causing the dizziness?"     Vomited x 5  9. RECURRENT SYMPTOM: "Have you had dizziness before?" If so, ask: "When was the last time?" "What happened that time?"     No 10. OTHER SYMPTOMS: "Do you have any other symptoms?" (e.g., fever, chest  pain, vomiting, diarrhea, bleeding)       Vomiting  11. PREGNANCY: "Is there any chance you are pregnant?" "When was your last menstrual period?"       Yes  Protocols used: [facility]

## 2018-07-07 ENCOUNTER — Encounter: Payer: Self-pay | Admitting: Family Medicine

## 2018-07-07 ENCOUNTER — Ambulatory Visit (INDEPENDENT_AMBULATORY_CARE_PROVIDER_SITE_OTHER): Payer: Medicaid Other | Admitting: Family Medicine

## 2018-07-07 VITALS — BP 119/79 | HR 92 | Temp 98.2°F | Ht 68.0 in | Wt 225.0 lb

## 2018-07-07 DIAGNOSIS — R112 Nausea with vomiting, unspecified: Secondary | ICD-10-CM

## 2018-07-07 DIAGNOSIS — R42 Dizziness and giddiness: Secondary | ICD-10-CM

## 2018-07-07 LAB — UA/M W/RFLX CULTURE, ROUTINE
Bilirubin, UA: NEGATIVE
Glucose, UA: NEGATIVE
KETONES UA: NEGATIVE
Leukocytes, UA: NEGATIVE
NITRITE UA: NEGATIVE
Protein, UA: NEGATIVE
SPEC GRAV UA: 1.02 (ref 1.005–1.030)
Urobilinogen, Ur: 0.2 mg/dL (ref 0.2–1.0)
pH, UA: 7.5 (ref 5.0–7.5)

## 2018-07-07 LAB — MICROSCOPIC EXAMINATION: WBC, UA: NONE SEEN /hpf (ref 0–5)

## 2018-07-07 LAB — PREGNANCY, URINE: Preg Test, Ur: NEGATIVE

## 2018-07-07 NOTE — Progress Notes (Signed)
BP 119/79   Pulse 92   Temp 98.2 F (36.8 C) (Oral)   Ht 5\' 8"  (1.727 m)   Wt 225 lb (102.1 kg)   SpO2 98%   BMI 34.21 kg/m    Subjective:    Patient ID: Donna Cox, female    DOB: 30-Mar-1991, 28 y.o.   MRN: [id number]  HPI: Donna Cox is a 28 y.o. female  Chief Complaint  Patient presents with  . Emesis    Vomiting all monday night  . Near Syncope    Yesterday. Lastedabout an hour.    Here today for N/V for two days. Had some dizziness that felt like she was going to pass out for about an hour which has now resolved. Did not take anything OTC for sxs. Feeling completely back to normal today. Denies fevers, chills, continued dizziness, syncope, visual changes, headaches, urinary sxs. Trying to get pregnant, has not recently taken a home pregnancy test. No new foods, recent travel, sick contacts.   Relevant past medical, surgical, family and social history reviewed and updated as indicated. Interim medical history since our last visit reviewed. Allergies and medications reviewed and updated.  Review of Systems  Per HPI unless specifically indicated above     Objective:    BP 119/79   Pulse 92   Temp 98.2 F (36.8 C) (Oral)   Ht 5\' 8"  (1.727 m)   Wt 225 lb (102.1 kg)   SpO2 98%   BMI 34.21 kg/m   Wt Readings from Last 3 Encounters:  07/07/18 225 lb (102.1 kg)  05/27/18 214 lb (97.1 kg)  12/28/17 228 lb 4.8 oz (103.6 kg)    Physical Exam Vitals signs and nursing note reviewed.  Constitutional:      Appearance: Normal appearance. She is not ill-appearing.  HENT:     Head: Atraumatic.  Eyes:     Extraocular Movements: Extraocular movements intact.     Conjunctiva/sclera: Conjunctivae normal.  Neck:     Musculoskeletal: Normal range of motion and neck supple.  Cardiovascular:     Rate and Rhythm: Normal rate and regular rhythm.     Heart sounds: Normal heart sounds.  Pulmonary:     Effort: Pulmonary effort is normal.     Breath sounds: Normal breath  sounds.  Abdominal:     General: Bowel sounds are normal.     Palpations: Abdomen is soft.     Tenderness: There is no abdominal tenderness. There is no right CVA tenderness, left CVA tenderness or guarding.  Musculoskeletal: Normal range of motion.  Skin:    General: Skin is warm and dry.  Neurological:     Mental Status: She is alert and oriented to person, place, and time.  Psychiatric:        Mood and Affect: Mood normal.        Thought Content: Thought content normal.        Judgment: Judgment normal.     Results for orders placed or performed in visit on 07/07/18  Microscopic Examination  Result Value Ref Range   WBC, UA None seen 0 - 5 /hpf   RBC, UA 0-2 0 - 2 /hpf   Epithelial Cells (non renal) 0-10 0 - 10 /hpf   Bacteria, UA Few (A) None seen/Few  Pregnancy, urine  Result Value Ref Range   Preg Test, Ur Negative Negative  Beta hCG quant (ref lab)  Result Value Ref Range   hCG Quant <1 mIU/mL  UA/M w/rflx Culture,  Routine  Result Value Ref Range   Specific Gravity, UA 1.020 1.005 - 1.030   pH, UA 7.5 5.0 - 7.5   Color, UA Yellow Yellow   Appearance Ur Clear Clear   Leukocytes, UA Negative Negative   Protein, UA Negative Negative/Trace   Glucose, UA Negative Negative   Ketones, UA Negative Negative   RBC, UA Trace (A) Negative   Bilirubin, UA Negative Negative   Urobilinogen, Ur 0.2 0.2 - 1.0 mg/dL   Nitrite, UA Negative Negative   Microscopic Examination See below:       Assessment & Plan:   Problem List Items Addressed This Visit    None    Visit Diagnoses    Nausea and vomiting, intractability of vomiting not specified, unspecified vomiting type    -  Primary   Resolved, declines anti emetics today. Urine preg and urinalysis neg, requesting beta hcg because urine testing was neg with her daughter initially   Relevant Orders   Pregnancy, urine (Completed)   Beta hCG quant (ref lab) (Completed)   UA/M w/rflx Culture, Routine (Completed)   Dizziness        Completely resolved. Return precautions reviewed       Follow up plan: Return if symptoms worsen or fail to improve.

## 2018-07-08 LAB — BETA HCG QUANT (REF LAB)

## 2018-07-12 IMAGING — US US OB TRANSVAGINAL
1 series · 14 of 28 positions shown · non-contrast
Comparison: CT, 07/29/2013

CLINICAL DATA: Vaginal bleeding for 1 day. Quantitative beta HCG
level is 52. Estimated pregnancy status is 11 weeks based on the
last menstrual period.

EXAM:
OBSTETRIC <14 WK US AND TRANSVAGINAL OB US
TECHNIQUE: Both transabdominal and transvaginal ultrasound examinations were
performed for complete evaluation of the gestation as well as the
maternal uterus, adnexal regions, and pelvic cul-de-sac.
Transvaginal technique was performed to assess early pregnancy.

[Series 1: us ob transvaginal · 0.25mm/px · 97 acquisitions, 14 frames shown]
[im 4/97]
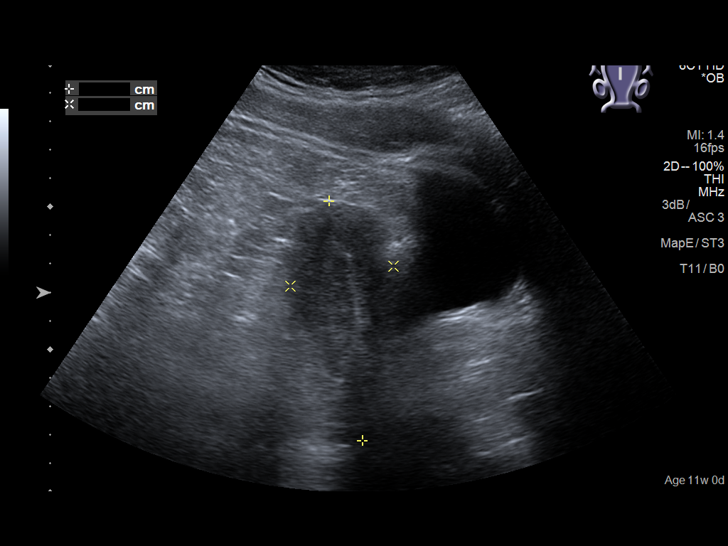
[im 11/97]
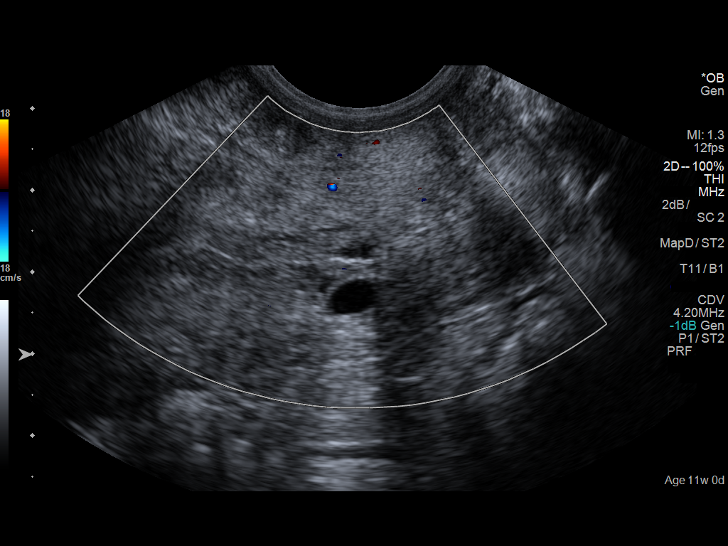
[im 18/97]
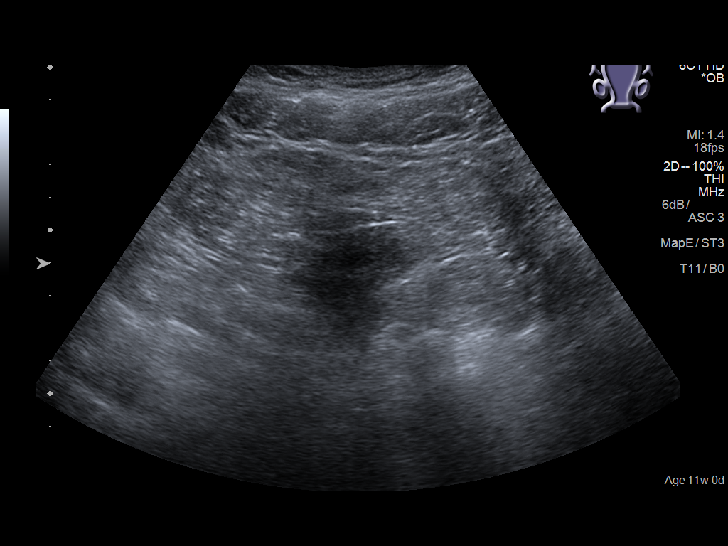
[im 25/97]
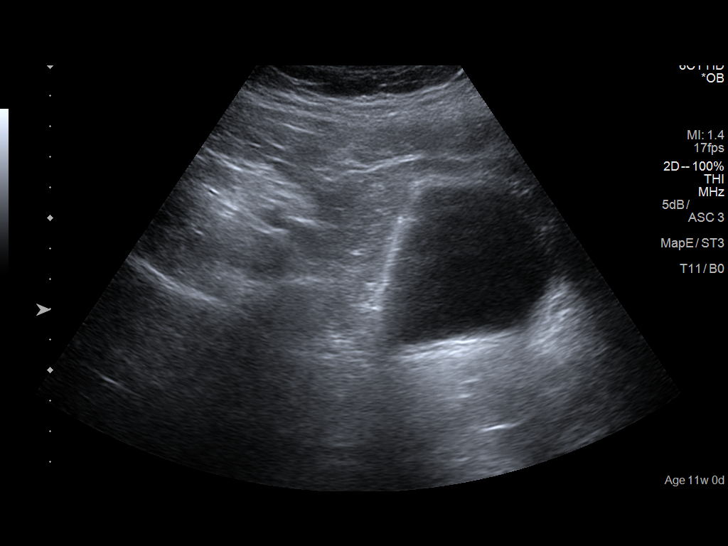
[im 33/97]
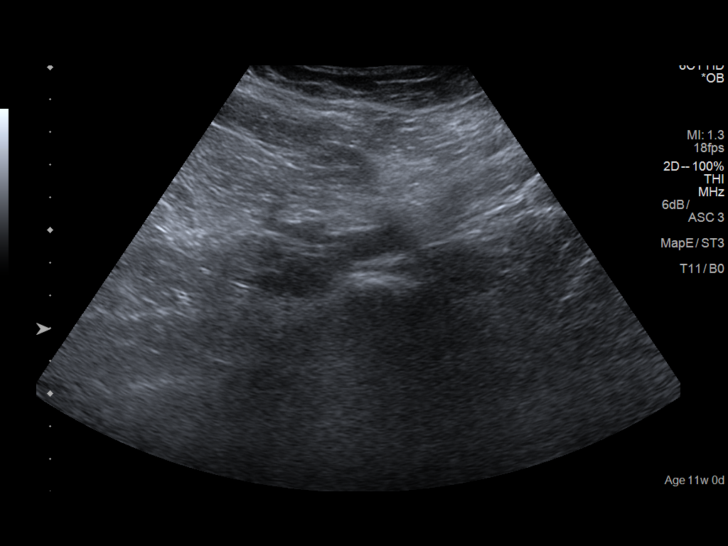
[im 40/97]
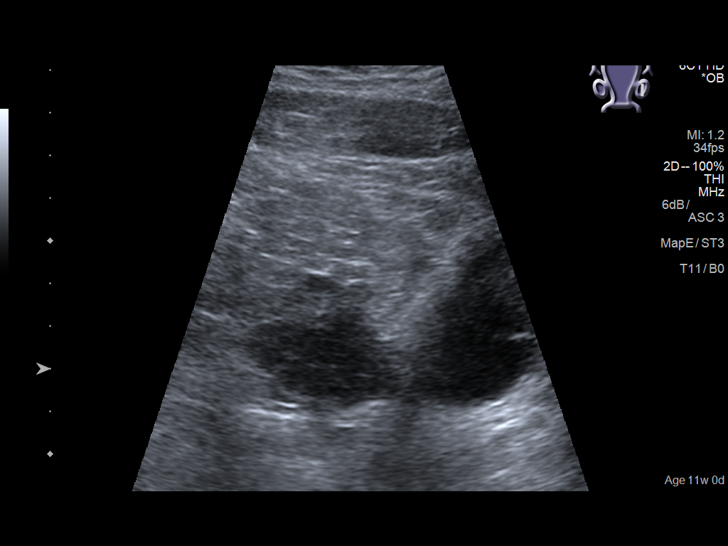
[im 47/97]
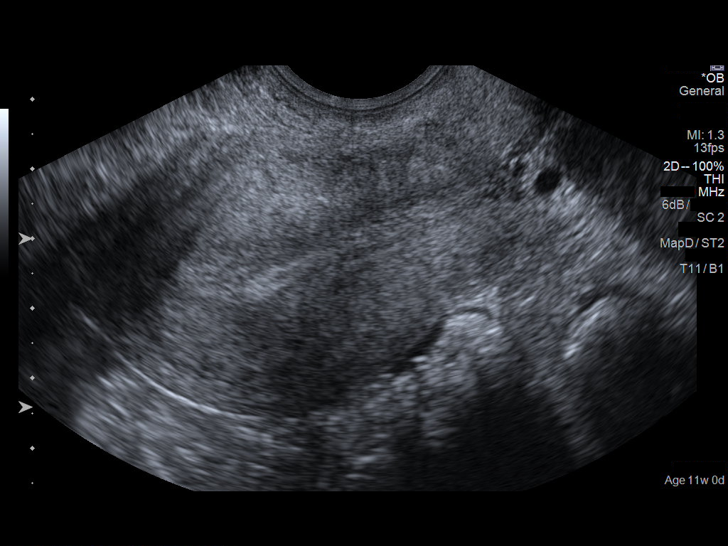
[im 54/97]
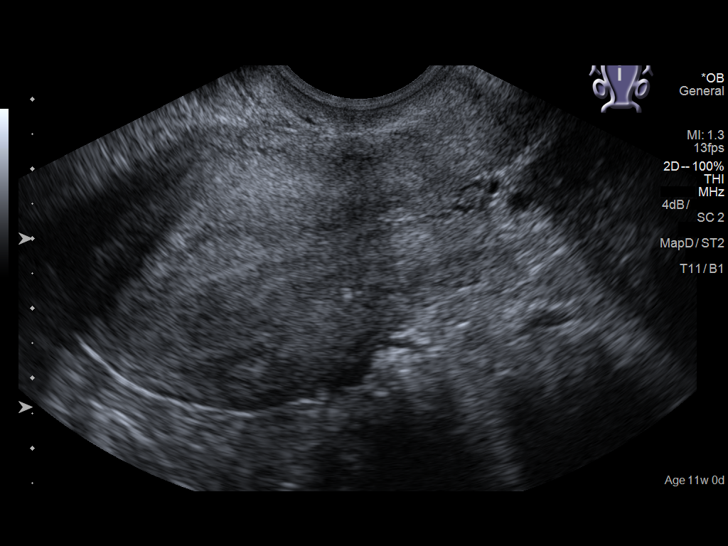
[im 61/97]
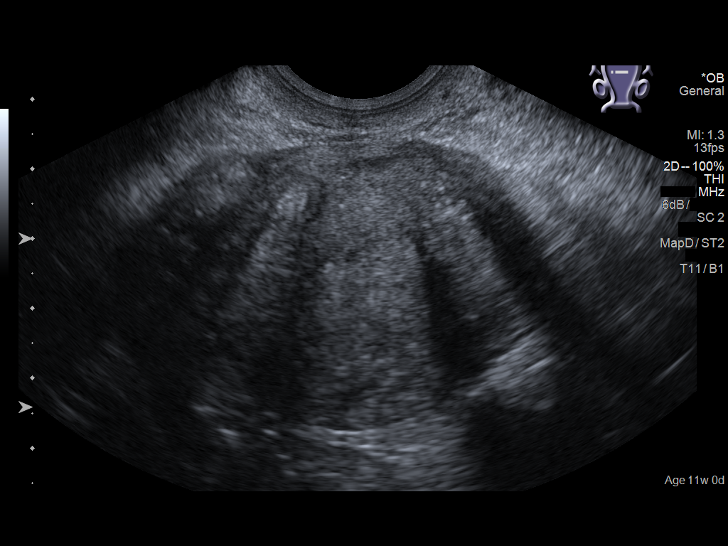
[im 68/97]
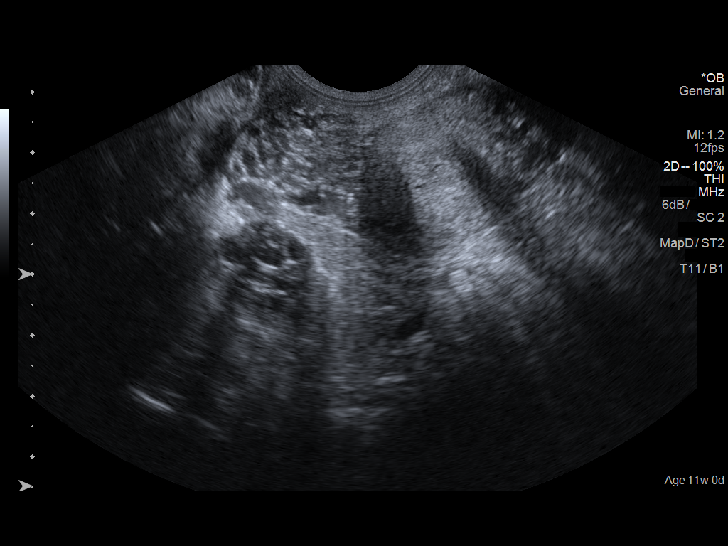
[im 75/97]
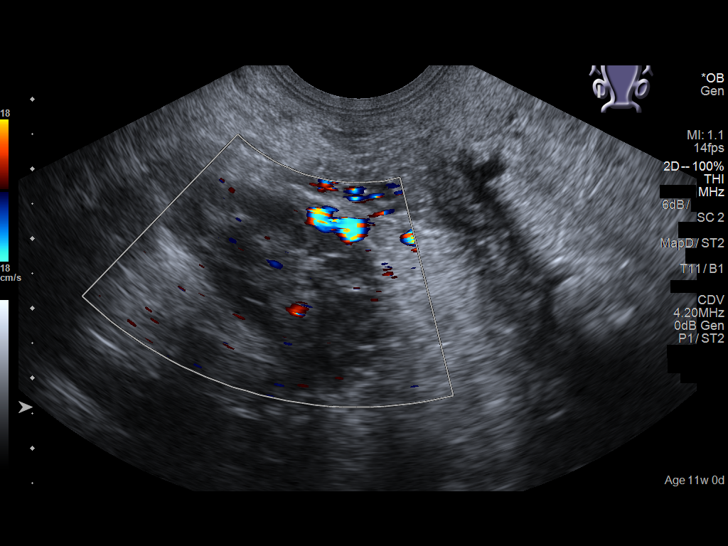
[im 82/97]
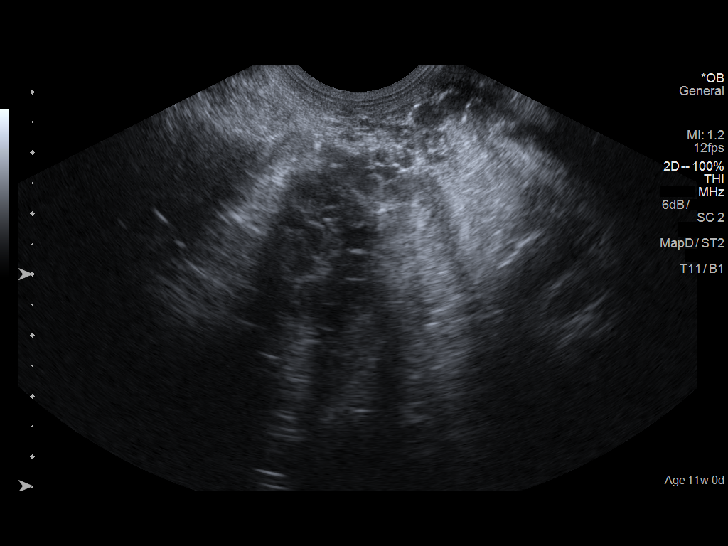
[im 89/97]
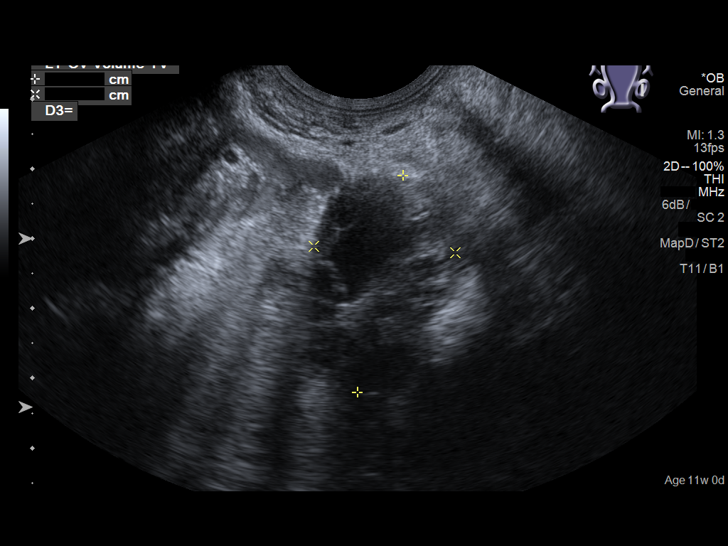
[im 97/97]
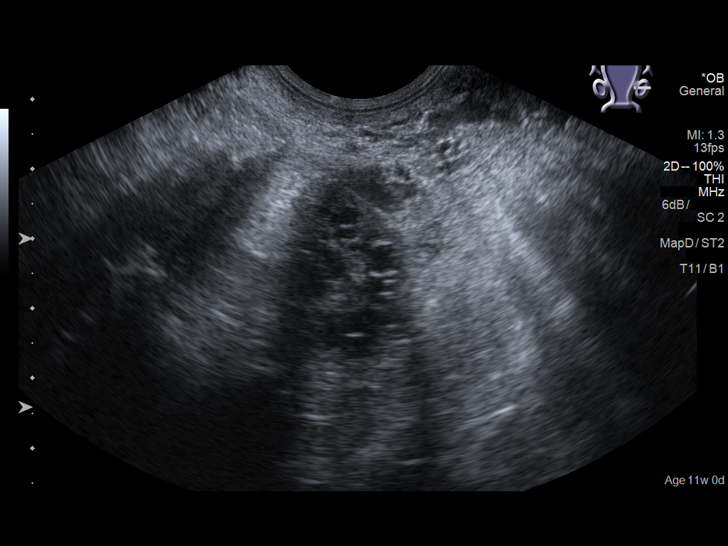

[14 of 28 positions shown; findings below may reference images not displayed]

FINDINGS: Intrauterine gestational sac: None

Yolk sac:  Not Visualized.

Embryo:  Not Visualized.

Cardiac Activity: Not Visualized.

Maternal uterus/adnexae: No uterine masses. There is some material
moving along the endometrium consistent with hemorrhage. Endometrium
is normal in thickness measuring 9 mm. No discrete endometrial mass.
Cervix is closed.

Normal ovaries and adnexa.  No pelvic free fluid.
IMPRESSION: 1. Findings consistent with a miscarriage. No intrauterine
pregnancy. There is mobile material along the endometrium that is
likely hemorrhage. No definite residual products of conception.
2. Normal ovaries and adnexa.

## 2018-08-23 IMAGING — DX DG TIBIA/FIBULA 2V*L*
4 series · 4 of 4 positions shown · non-contrast
Comparison: None.

CLINICAL DATA: Fall with left lower leg and foot pain.

EXAM:
LEFT TIBIA AND FIBULA - 2 VIEW

[tibia ap (1 of 2)]
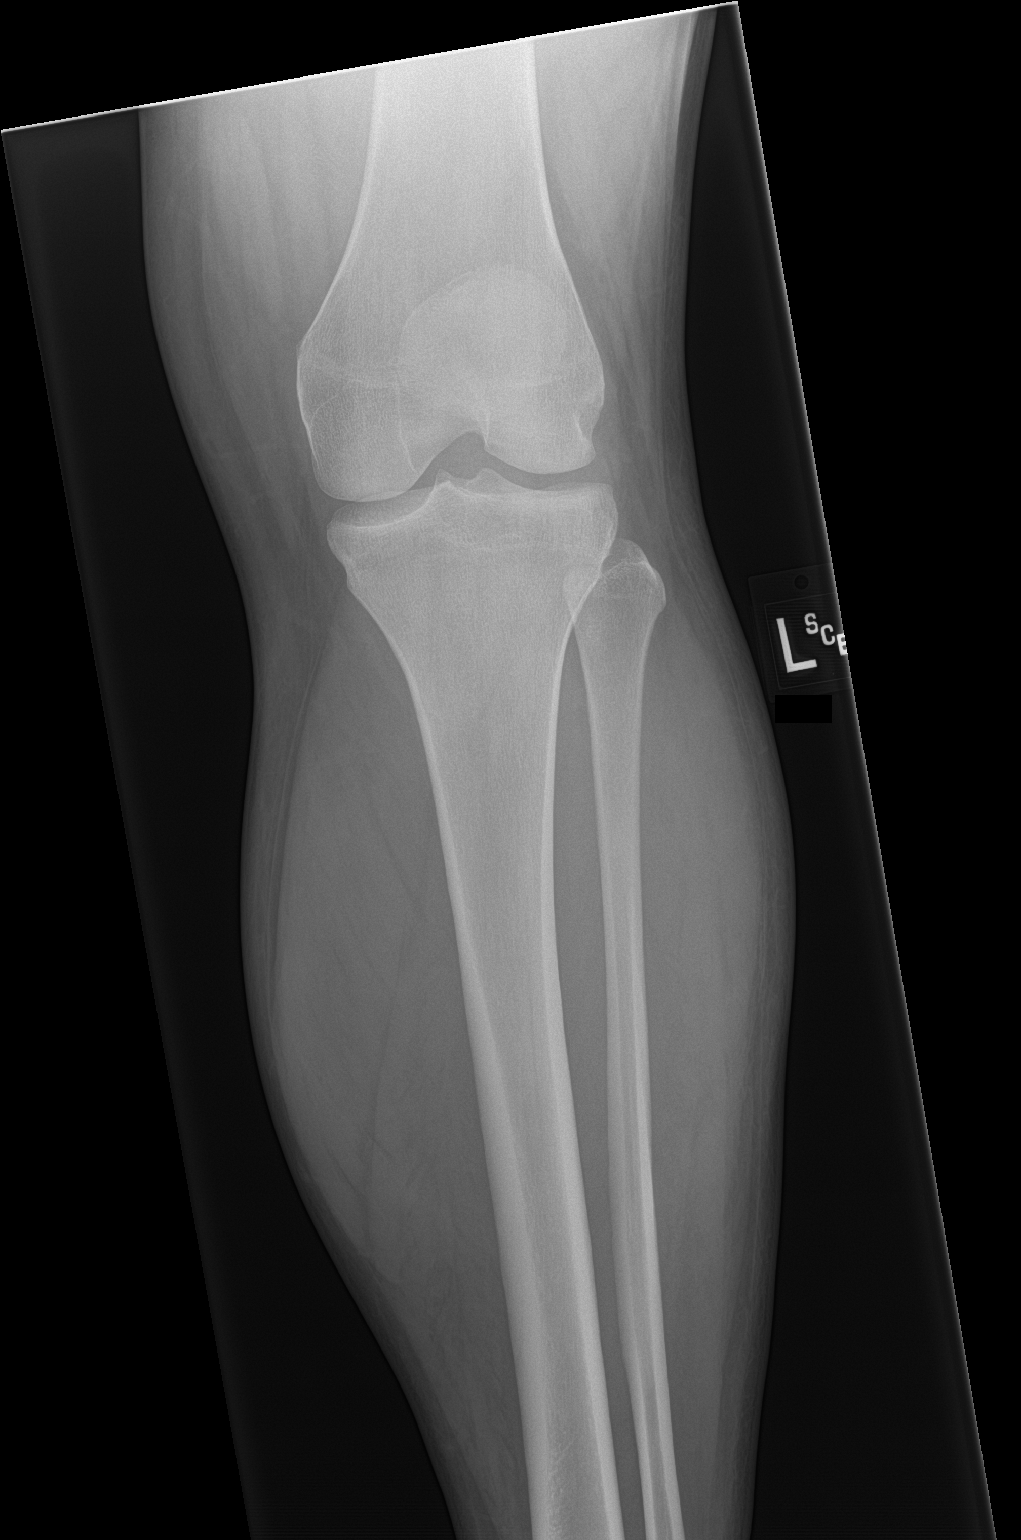

[tibia ap (2 of 2)]
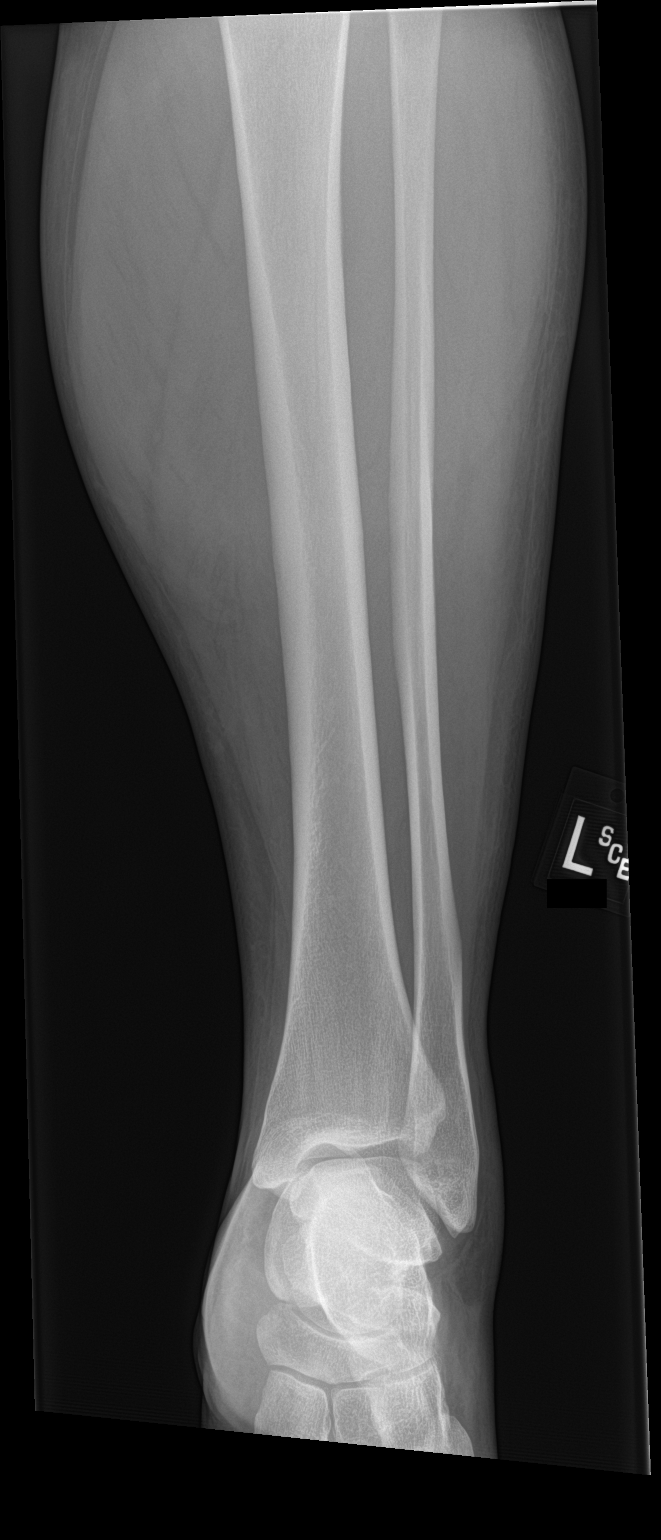

[tibia lat (1 of 2)]
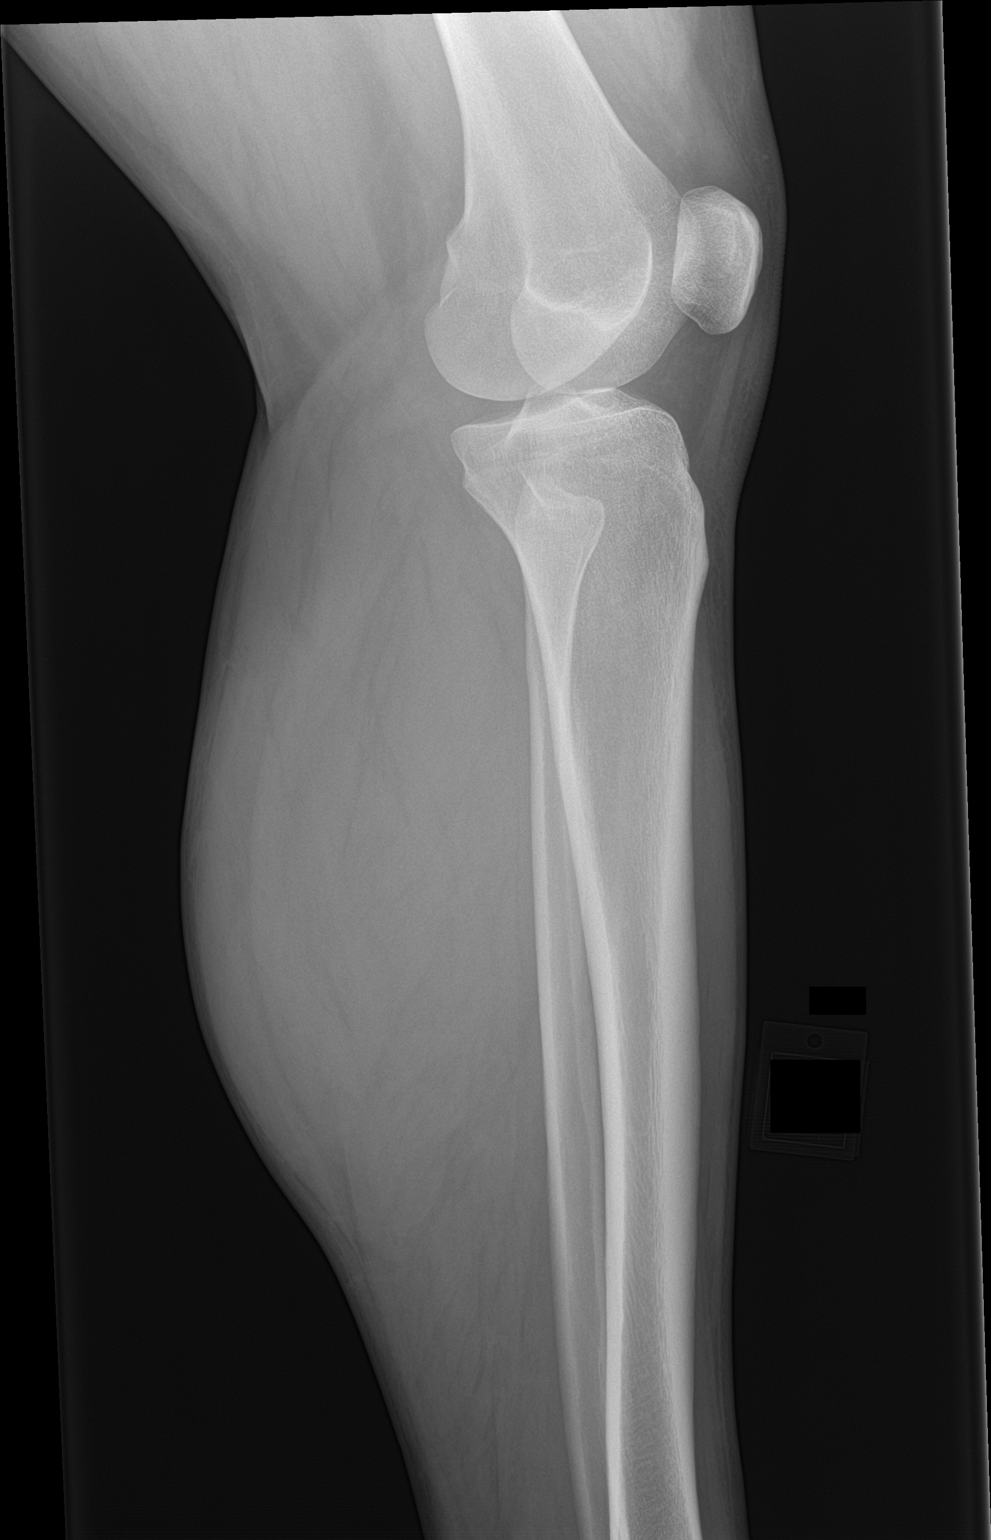

[tibia lat (2 of 2)]
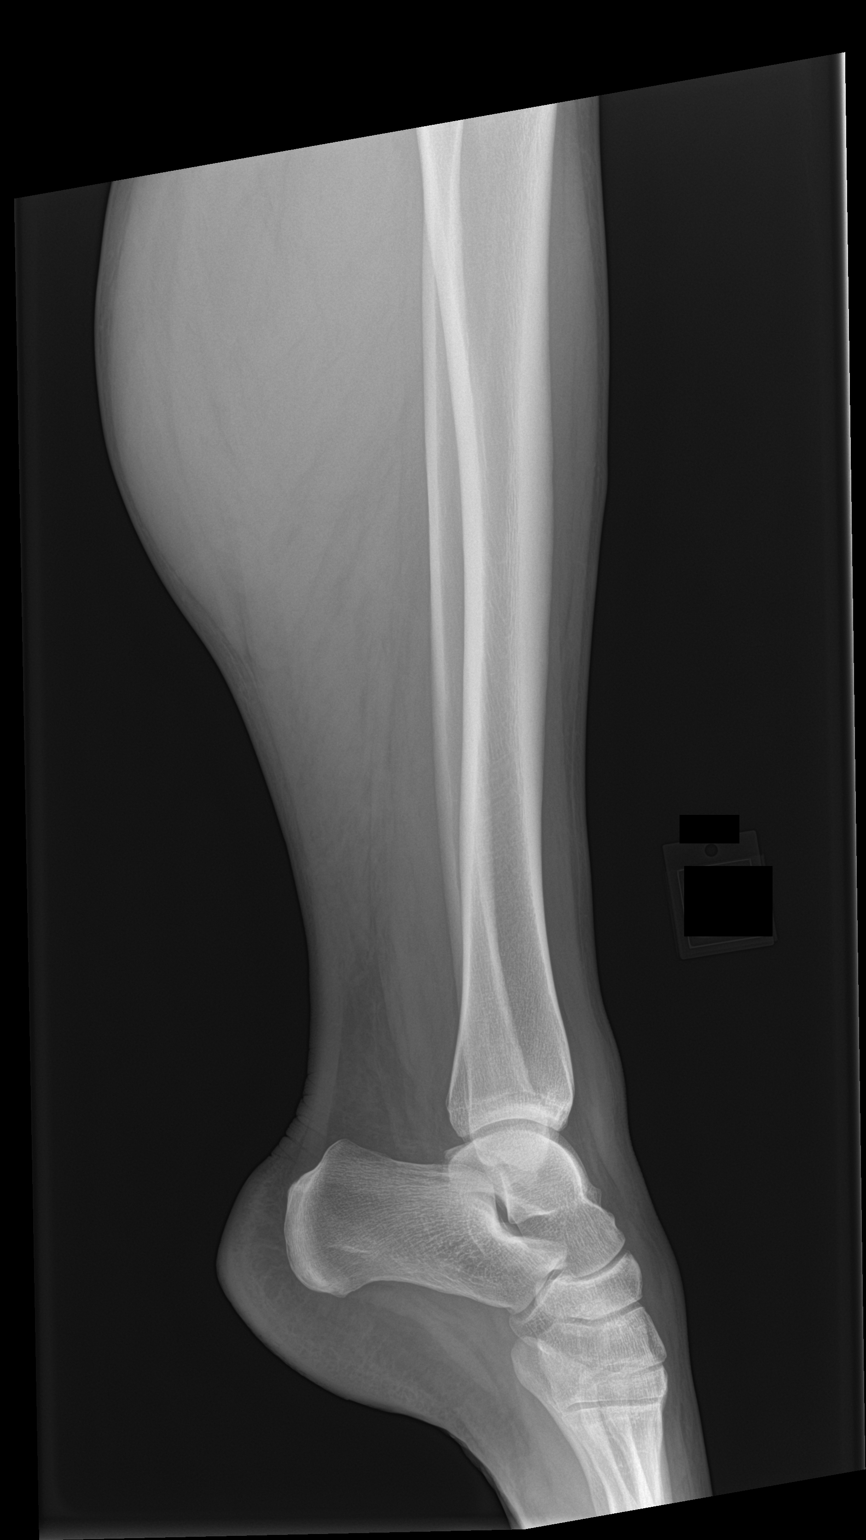

[4 of 4 positions shown; findings below may reference images not displayed]

FINDINGS: There is no evidence of fracture or other focal bone lesions. Soft
tissues are unremarkable.
IMPRESSION: Negative.

## 2018-09-12 ENCOUNTER — Telehealth: Payer: Medicaid Other | Admitting: Physician Assistant

## 2018-09-12 ENCOUNTER — Encounter: Payer: Self-pay | Admitting: Physician Assistant

## 2018-09-12 DIAGNOSIS — R0602 Shortness of breath: Secondary | ICD-10-CM

## 2018-09-12 DIAGNOSIS — R6889 Other general symptoms and signs: Secondary | ICD-10-CM

## 2018-09-12 MED ORDER — BENZONATATE 100 MG PO CAPS: 100.0000 mg | ORAL_CAPSULE | Freq: Three times a day (TID) | ORAL | 0 refills | Status: DC | PRN

## 2018-09-12 NOTE — Progress Notes (Signed)
E-Visit for Corona Virus Screening  Based on your current symptoms, you may very well have the virus, however your symptoms are mild. Currently, not all patients are being tested. If the symptoms are mild and there is not a known exposure, performing the test is not indicated.  Coronavirus disease 2019 (COVID-19) is a respiratory illness that can spread from person to person. The virus that causes COVID-19 is a new virus that was first identified in the country of [place] but is now found in multiple other countries and has spread to the [place].  Symptoms associated with the virus are mild to severe fever, cough, and shortness of breath. There is currently no vaccine to protect against COVID-19, and there is no specific antiviral treatment for the virus.   To be considered HIGH RISK for Coronavirus (COVID-19), you have to meet the following criteria:  . Traveled to [place], [place], [place], [place] or [place]; or in the [place] to [place], [place], [place], or [place]; and have fever, cough, and shortness of breath within the last 2 weeks of travel OR  . Been in close contact with a person diagnosed with COVID-19 within the last 2 weeks and have fever, cough, and shortness of breath  . IF YOU DO NOT MEET THESE CRITERIA, YOU ARE CONSIDERED LOW RISK FOR COVID-19.   It is vitally important that if you feel that you have an infection such as this virus or any other virus that you stay home and away from places where you may spread it to others.  You should self-quarantine for 14 days if you have symptoms that could potentially be coronavirus and avoid contact with people age 65 and older.   You can use medication such as A prescription cough medication called Tessalon Perles 100 mg. You may take 1-2 capsules every 8 hours as needed for cough  You may also take acetaminophen (Tylenol) as needed for fever.   Reduce your risk of any infection by using the same precautions used for  avoiding the common cold or flu:  . Wash your hands often with soap and warm water for at least 20 seconds.  If soap and water are not readily available, use an alcohol-based hand sanitizer with at least 60% alcohol.  . If coughing or sneezing, cover your mouth and nose by coughing or sneezing into the elbow areas of your shirt or coat, into a tissue or into your sleeve (not your hands). . Avoid shaking hands with others and consider head nods or verbal greetings only. . Avoid touching your eyes, nose, or mouth with unwashed hands.  . Avoid close contact with people who are sick. . Avoid places or events with large numbers of people in one location, like concerts or sporting events. . Carefully consider travel plans you have or are making. . If you are planning any travel outside or inside the [place], visit the CDC's Travelers' Health webpage for the latest health notices. . If you have some symptoms but not all symptoms, continue to monitor at home and seek medical attention if your symptoms worsen. . If you are having a medical emergency, call 911.  HOME CARE . Only take medications as instructed by your medical team. . Drink plenty of fluids and get plenty of rest. . A steam or ultrasonic humidifier can help if you have congestion.   GET HELP RIGHT AWAY IF: . You develop worsening fever. . You become short of breath . You cough   up blood. . Your symptoms become more severe MAKE SURE YOU   Understand these instructions.  Will watch your condition.  Will get help right away if you are not doing well or get worse.  Your e-visit answers were reviewed by a board certified advanced clinical practitioner to complete your personal care plan.  Depending on the condition, your plan could have included both over the counter or prescription medications.  If there is a problem please reply once you have received a response from your provider. Your safety is important to [place].  If you have drug  allergies check your prescription carefully.    You can use MyChart to ask questions about today's visit, request a non-urgent call back, or ask for a work or school excuse for 24 hours related to this e-Visit. If it has been greater than 24 hours you will need to follow up with your provider, or enter a new e-Visit to address those concerns. You will get an e-mail in the next two days asking about your experience.  I hope that your e-visit has been valuable and will speed your recovery. Thank you for using e-visits.   I have spent 7 min in completion and review of this note- Lacy [facility]ney [facility]

## 2018-09-20 ENCOUNTER — Telehealth: Payer: Self-pay | Admitting: Family Medicine

## 2018-09-20 NOTE — Telephone Encounter (Signed)
Copied from Page [id number]. Topic: Quick Communication - See Telephone Encounter >> Sep 20, 2018 10:51 AM Nils Flack wrote: CRM for notification. See Telephone encounter for: 09/20/18. Pt taken would of work last Sunday 09/12/18. Her boss thought she had symptoms of covid. She had fever, runny noise, cough.  She did an evisit at that time.  Now, symptoms are mostly gone. Her boss is requiring a neg flu, strep and covid test before she can return to work.  She does not have insurance and wants to know how to handle this.   Cb is [phone]

## 2018-09-20 NOTE — Telephone Encounter (Signed)
She does not meet criteria for COVID testing, and has already met the suggested quarantine time frame for flu. My recommendation would be to complete the 2 week COVID quarantine instead of having a negative test or I can provide a letter to return once she's been asymptomatic for 72 hours after evaluation via virtual visit

## 2018-09-20 NOTE — Telephone Encounter (Signed)
Pt started getting sick on the 09/12/18. Pt advised she needed to quarantine for another week. Visit scheduled for next Monday for clearance to return to work.

## 2018-09-27 ENCOUNTER — Encounter: Payer: Self-pay | Admitting: Family Medicine

## 2018-09-27 ENCOUNTER — Other Ambulatory Visit: Payer: Self-pay

## 2018-09-27 ENCOUNTER — Ambulatory Visit (INDEPENDENT_AMBULATORY_CARE_PROVIDER_SITE_OTHER): Payer: BLUE CROSS/BLUE SHIELD | Admitting: Family Medicine

## 2018-09-27 DIAGNOSIS — R0602 Shortness of breath: Secondary | ICD-10-CM

## 2018-09-27 DIAGNOSIS — R5383 Other fatigue: Secondary | ICD-10-CM | POA: Diagnosis not present

## 2018-09-27 DIAGNOSIS — R05 Cough: Secondary | ICD-10-CM

## 2018-09-27 DIAGNOSIS — R059 Cough, unspecified: Secondary | ICD-10-CM

## 2018-09-27 MED ORDER — AZITHROMYCIN 250 MG PO TABS: ORAL_TABLET | ORAL | 0 refills | Status: DC

## 2018-09-27 NOTE — Progress Notes (Signed)
There were no vitals taken for this visit.   Subjective:    Patient ID: Donna Cox, female    DOB: March 16, 1991, 28 y.o.   MRN: [id number]  HPI: Donna Cox is a 28 y.o. female  Chief Complaint  Patient presents with  . Cough    Patient was due for an appointment to be released for work, but her symptoms worsened 4 days. Symptoms keep worsening.   . Fatigue  . Shortness of Breath  . Hoarse  . Generalized Body Aches  . Diarrhea    . This visit was completed via WebEx due to the restrictions of the COVID-19 pandemic. All issues as above were discussed and addressed. Physical exam was done as above through visual confirmation on WebEx. If it was felt that the patient should be evaluated in the office, they were directed there. The patient verbally consented to this visit. . Location of the patient: home . Location of the provider: home . Those involved with this call:  . Provider: Merrie Roof, PA-C . CMA: Merilyn Baba, [place] . Front Desk/Registration: Jill Side  . Time spent on call: 23 minutes with patient face to face via video conference. More than 50% of this time was spent in counseling and coordination of care.  Pt just completing 2 week quarantine for viral illness with inability to r/o COVID-19. Was feeling much better until the past 4 days, when she began feeling exhausted, achy, short of breath, and having hoarseness, productive cough, and diarrhea. Denies known fevers the past few days, rashes, vomiting. Taking tylenol prn with some mild relief. No new sick contacts, was compliant with quarantine. No recent travel.   Relevant past medical, surgical, family and social history reviewed and updated as indicated. Interim medical history since our last visit reviewed. Allergies and medications reviewed and updated.  Review of Systems  Per HPI unless specifically indicated above     Objective:    There were no vitals taken for this visit.  Wt Readings from Last 3  Encounters:  07/07/18 225 lb (102.1 kg)  05/27/18 214 lb (97.1 kg)  12/28/17 228 lb 4.8 oz (103.6 kg)    Physical Exam Vitals signs and nursing note reviewed.  Constitutional:      General: She is not in acute distress.    Appearance: Normal appearance. She is ill-appearing (laying in her bed, appears weak and fatigued).  HENT:     Head: Atraumatic.     Right Ear: External ear normal.     Left Ear: External ear normal.     Nose: Nose normal. No congestion.     Mouth/Throat:     Mouth: Mucous membranes are moist.     Pharynx: Posterior oropharyngeal erythema present.  Eyes:     Extraocular Movements: Extraocular movements intact.     Conjunctiva/sclera: Conjunctivae normal.  Neck:     Musculoskeletal: Normal range of motion.  Cardiovascular:     Comments: Unable to assess via virtual visit Pulmonary:     Effort: Pulmonary effort is normal. No respiratory distress.  Abdominal:     Comments: Pt reports no ttp on self abdominal exam  Musculoskeletal: Normal range of motion.  Skin:    General: Skin is dry.     Findings: No erythema.  Neurological:     Mental Status: She is alert and oriented to person, place, and time.  Psychiatric:        Mood and Affect: Mood normal.  Thought Content: Thought content normal.        Judgment: Judgment normal.     Results for orders placed or performed in visit on 07/07/18  Microscopic Examination  Result Value Ref Range   WBC, UA None seen 0 - 5 /hpf   RBC, UA 0-2 0 - 2 /hpf   Epithelial Cells (non renal) 0-10 0 - 10 /hpf   Bacteria, UA Few (A) None seen/Few  Pregnancy, urine  Result Value Ref Range   Preg Test, Ur Negative Negative  Beta hCG quant (ref lab)  Result Value Ref Range   hCG Quant <1 mIU/mL  UA/M w/rflx Culture, Routine  Result Value Ref Range   Specific Gravity, UA 1.020 1.005 - 1.030   pH, UA 7.5 5.0 - 7.5   Color, UA Yellow Yellow   Appearance Ur Clear Clear   Leukocytes, UA Negative Negative   Protein,  UA Negative Negative/Trace   Glucose, UA Negative Negative   Ketones, UA Negative Negative   RBC, UA Trace (A) Negative   Bilirubin, UA Negative Negative   Urobilinogen, Ur 0.2 0.2 - 1.0 mg/dL   Nitrite, UA Negative Negative   Microscopic Examination See below:       Assessment & Plan:   Problem List Items Addressed This Visit    None    Visit Diagnoses    SOB (shortness of breath)    -  Primary   Fatigue, unspecified type       Cough        Given worsening course over a week from onset of suspected viral illness, will start zpak in case turning into bacterial infection. Will extend work note for at least 72 hours and recheck prior to clearance back. Pt instructed to call back if not improving over next 48 hours or worsening at any point and I will order a chest x-ray for her to go get. Strict return precautions given, knows to go to ED if experiencing significant SOB, intractable fevers etc  Follow up plan: Return if symptoms worsen or fail to improve.

## 2018-09-29 ENCOUNTER — Telehealth: Payer: Self-pay | Admitting: Family Medicine

## 2018-09-29 NOTE — Telephone Encounter (Signed)
Patient was not cleared to work, her work note was extended at least until Thursday as she is still not feeling well and needs a follow up before getting a clearance letter - there may have been some misunderstanding about that. At the time of her follow up we can discuss some sort of documentation about not testing to satisfy her employer  Copied from [place] [phone]. Topic: General - Other >> Sep 28, 2018  2:50 PM Wynetta Emery, [place] C wrote: Reason for CRM:  pt called in requesting a call back. Pt says that she had a virtual visit with Orene Desanctis and was cleared to return to work. Pt says that her employer still refuse to let her return without having a test completed. Pt would like to know if provider could give her a note stating that test are not given to people without covid symptoms.   Pt would like a call back   CB: [phone]

## 2018-09-29 NOTE — Telephone Encounter (Signed)
Follow up appointment scheduled.

## 2018-09-30 ENCOUNTER — Ambulatory Visit (INDEPENDENT_AMBULATORY_CARE_PROVIDER_SITE_OTHER): Payer: BLUE CROSS/BLUE SHIELD | Admitting: Family Medicine

## 2018-09-30 ENCOUNTER — Encounter: Payer: Self-pay | Admitting: Family Medicine

## 2018-09-30 ENCOUNTER — Other Ambulatory Visit: Payer: Self-pay

## 2018-09-30 DIAGNOSIS — R05 Cough: Secondary | ICD-10-CM

## 2018-09-30 DIAGNOSIS — J069 Acute upper respiratory infection, unspecified: Secondary | ICD-10-CM

## 2018-09-30 DIAGNOSIS — R059 Cough, unspecified: Secondary | ICD-10-CM

## 2018-09-30 NOTE — Progress Notes (Signed)
There were no vitals taken for this visit.   Subjective:    Patient ID: Donna Cox, female    DOB: 1990/12/01, 28 y.o.   MRN: [id number]  HPI: Donna Cox is a 28 y.o. female  Chief Complaint  Patient presents with  . Letter for School/Work    Needs clearance to return to work  . Fatigue    Still tired.     . This visit was completed via WebEx due to the restrictions of the COVID-19 pandemic. All issues as above were discussed and addressed. Physical exam was done as above through visual confirmation on WebEx. If it was felt that the patient should be evaluated in the office, they were directed there. The patient verbally consented to this visit. . Location of the patient: home . Location of the provider: work . Those involved with this call:  . Provider: Merrie Roof, PA-C . CMA: Lesle Chris, [place] . Front Desk/Registration: Jill Side  . Time spent on call: 15 minutes with patient face to face via video conference. More than 50% of this time was spent in counseling and coordination of care. 5 minutes total spent in review of patient's record and preparation of their chart.  Pt here today for URI f/u and work clearance if able. Significant improvement since taking abx, no further fevers x 2-3 days now and minimal lingering cough. No notable congestion, SOB, CP. Lingering fatigue is the most bothersome symptom at this point. Has been compliant with quarantine the past 2+ weeks and has had no new sick contacts.   Relevant past medical, surgical, family and social history reviewed and updated as indicated. Interim medical history since our last visit reviewed. Allergies and medications reviewed and updated.  Review of Systems  Per HPI unless specifically indicated above     Objective:    There were no vitals taken for this visit.  Wt Readings from Last 3 Encounters:  07/07/18 225 lb (102.1 kg)  05/27/18 214 lb (97.1 kg)  12/28/17 228 lb 4.8 oz (103.6 kg)    Physical Exam  Vitals signs and nursing note reviewed.  Constitutional:      General: She is not in acute distress.    Appearance: Normal appearance.  HENT:     Head: Atraumatic.     Right Ear: External ear normal.     Left Ear: External ear normal.     Nose: Nose normal. No congestion.     Mouth/Throat:     Mouth: Mucous membranes are moist.     Pharynx: Oropharynx is clear. Posterior oropharyngeal erythema present.  Eyes:     Extraocular Movements: Extraocular movements intact.     Conjunctiva/sclera: Conjunctivae normal.  Neck:     Musculoskeletal: Normal range of motion.  Cardiovascular:     Comments: Unable to assess via virtual visit Pulmonary:     Effort: Pulmonary effort is normal. No respiratory distress.  Musculoskeletal: Normal range of motion.  Skin:    General: Skin is dry.     Findings: No erythema.  Neurological:     Mental Status: She is alert and oriented to person, place, and time.  Psychiatric:        Mood and Affect: Mood normal.        Thought Content: Thought content normal.        Judgment: Judgment normal.     Results for orders placed or performed in visit on 07/07/18  Microscopic Examination  Result Value Ref Range   WBC,  UA None seen 0 - 5 /hpf   RBC, UA 0-2 0 - 2 /hpf   Epithelial Cells (non renal) 0-10 0 - 10 /hpf   Bacteria, UA Few (A) None seen/Few  Pregnancy, urine  Result Value Ref Range   Preg Test, Ur Negative Negative  Beta hCG quant (ref lab)  Result Value Ref Range   hCG Quant <1 mIU/mL  UA/M w/rflx Culture, Routine  Result Value Ref Range   Specific Gravity, UA 1.020 1.005 - 1.030   pH, UA 7.5 5.0 - 7.5   Color, UA Yellow Yellow   Appearance Ur Clear Clear   Leukocytes, UA Negative Negative   Protein, UA Negative Negative/Trace   Glucose, UA Negative Negative   Ketones, UA Negative Negative   RBC, UA Trace (A) Negative   Bilirubin, UA Negative Negative   Urobilinogen, Ur 0.2 0.2 - 1.0 mg/dL   Nitrite, UA Negative Negative    Microscopic Examination See below:       Assessment & Plan:   Problem List Items Addressed This Visit    None    Visit Diagnoses    Upper respiratory tract infection, unspecified type    -  Primary   Significantly improved, meets guidelines for return to work. will release back to work for Monday, April 13.    Cough       Improved, continue supportive care prn   Relevant Orders   DG Chest 2 View       Follow up plan: Return if symptoms worsen or fail to improve.

## 2018-10-01 ENCOUNTER — Encounter: Payer: Self-pay | Admitting: Family Medicine

## 2018-10-06 ENCOUNTER — Ambulatory Visit: Payer: Self-pay | Admitting: Family Medicine

## 2018-10-22 ENCOUNTER — Encounter: Payer: Self-pay | Admitting: Family Medicine

## 2018-10-27 ENCOUNTER — Encounter: Payer: Self-pay | Admitting: Family Medicine

## 2018-10-27 ENCOUNTER — Other Ambulatory Visit: Payer: Self-pay

## 2018-10-27 ENCOUNTER — Ambulatory Visit (INDEPENDENT_AMBULATORY_CARE_PROVIDER_SITE_OTHER): Payer: BLUE CROSS/BLUE SHIELD | Admitting: Family Medicine

## 2018-10-27 VITALS — Ht 68.0 in | Wt 225.0 lb

## 2018-10-27 DIAGNOSIS — B353 Tinea pedis: Secondary | ICD-10-CM | POA: Diagnosis not present

## 2018-10-27 MED ORDER — TERBINAFINE HCL 250 MG PO TABS: 250.0000 mg | ORAL_TABLET | Freq: Every day | ORAL | 0 refills | Status: DC

## 2018-10-27 NOTE — Progress Notes (Signed)
Ht 5\' 8"  (1.727 m)   Wt 225 lb (102.1 kg)   BMI 34.21 kg/m    Subjective:    Patient ID: Donna Cox, female    DOB: 07-13-1990, 28 y.o.   MRN: [id number]  HPI: Donna Cox is a 28 y.o. female  Chief Complaint  Patient presents with  . Nail Problem    Patient has noticed her toenails are yellow and may need an antifungal. Getting worse and ongoing about 3 months.    . This visit was completed via WebEx due to the restrictions of the COVID-19 pandemic. All issues as above were discussed and addressed. Physical exam was done as above through visual confirmation on WebEx. If it was felt that the patient should be evaluated in the office, they were directed there. The patient verbally consented to this visit. . Location of the patient: in car . Location of the provider: home . Those involved with this call:  . Provider: Merrie Roof, PA-C . CMA: Merilyn Baba, [place] . Front Desk/Registration: Jill Side  . Time spent on call: 15 minutes with patient face to face via video conference. More than 50% of this time was spent in counseling and coordination of care. 5 minutes total spent in review of patient's record and preparation of their chart. I verified patient identity using two factors (patient name and date of birth). Patient consents verbally to being seen via telemedicine visit today.   Patient presenting today with 3-6 months of toenail discoloration, foot itching and peeling and now also having significant foot odor. Sxs worsening over time. Denies pain, foot swelling, broken nails, redness, drainage. Tried OTC antifungal products x 3 months with no benefit.   Relevant past medical, surgical, family and social history reviewed and updated as indicated. Interim medical history since our last visit reviewed. Allergies and medications reviewed and updated.  Review of Systems  Per HPI unless specifically indicated above     Objective:    Ht 5\' 8"  (1.727 m)   Wt 225 lb (102.1  kg)   BMI 34.21 kg/m   Wt Readings from Last 3 Encounters:  10/27/18 225 lb (102.1 kg)  07/07/18 225 lb (102.1 kg)  05/27/18 214 lb (97.1 kg)    Physical Exam Vitals signs and nursing note reviewed.  Constitutional:      General: She is not in acute distress.    Appearance: Normal appearance.  HENT:     Head: Atraumatic.     Right Ear: External ear normal.     Left Ear: External ear normal.     Nose: Nose normal. No congestion.     Mouth/Throat:     Mouth: Mucous membranes are moist.     Pharynx: Oropharynx is clear. No posterior oropharyngeal erythema.  Eyes:     Extraocular Movements: Extraocular movements intact.     Conjunctiva/sclera: Conjunctivae normal.  Neck:     Musculoskeletal: Normal range of motion.  Cardiovascular:     Comments: Unable to assess via virtual visit Pulmonary:     Effort: Pulmonary effort is normal. No respiratory distress.  Musculoskeletal: Normal range of motion.  Skin:    General: Skin is dry.     Findings: No erythema.     Comments: No obvious open areas on b/l feet, some dry skin/peeling able to be noted based on visual exam through video screen  Neurological:     Mental Status: She is alert and oriented to person, place, and time.  Psychiatric:  Mood and Affect: Mood normal.        Thought Content: Thought content normal.        Judgment: Judgment normal.     Results for orders placed or performed in visit on 07/07/18  Microscopic Examination  Result Value Ref Range   WBC, UA None seen 0 - 5 /hpf   RBC, UA 0-2 0 - 2 /hpf   Epithelial Cells (non renal) 0-10 0 - 10 /hpf   Bacteria, UA Few (A) None seen/Few  Pregnancy, urine  Result Value Ref Range   Preg Test, Ur Negative Negative  Beta hCG quant (ref lab)  Result Value Ref Range   hCG Quant <1 mIU/mL  UA/M w/rflx Culture, Routine  Result Value Ref Range   Specific Gravity, UA 1.020 1.005 - 1.030   pH, UA 7.5 5.0 - 7.5   Color, UA Yellow Yellow   Appearance Ur Clear  Clear   Leukocytes, UA Negative Negative   Protein, UA Negative Negative/Trace   Glucose, UA Negative Negative   Ketones, UA Negative Negative   RBC, UA Trace (A) Negative   Bilirubin, UA Negative Negative   Urobilinogen, Ur 0.2 0.2 - 1.0 mg/dL   Nitrite, UA Negative Negative   Microscopic Examination See below:       Assessment & Plan:   Problem List Items Addressed This Visit    None    Visit Diagnoses    Tinea pedis of both feet    -  Primary   Failed topical treatment with several OTC creams/powders. Start oral lamisil x 2 weeks and f/u if not improving   Relevant Medications   terbinafine (LAMISIL) 250 MG tablet       Follow up plan: Return if symptoms worsen or fail to improve.

## 2018-11-26 ENCOUNTER — Encounter: Payer: Self-pay | Admitting: Family Medicine

## 2018-11-29 ENCOUNTER — Other Ambulatory Visit: Payer: Self-pay | Admitting: Family Medicine

## 2018-11-29 MED ORDER — TERBINAFINE HCL 250 MG PO TABS: 250.0000 mg | ORAL_TABLET | Freq: Every day | ORAL | 0 refills | Status: DC

## 2018-12-07 ENCOUNTER — Other Ambulatory Visit: Payer: Self-pay

## 2018-12-07 ENCOUNTER — Ambulatory Visit (INDEPENDENT_AMBULATORY_CARE_PROVIDER_SITE_OTHER): Payer: BLUE CROSS/BLUE SHIELD | Admitting: Obstetrics and Gynecology

## 2018-12-07 ENCOUNTER — Encounter: Payer: Self-pay | Admitting: Obstetrics and Gynecology

## 2018-12-07 VITALS — Ht 68.0 in | Wt 215.0 lb

## 2018-12-07 DIAGNOSIS — N970 Female infertility associated with anovulation: Secondary | ICD-10-CM

## 2018-12-07 DIAGNOSIS — Z6832 Body mass index (BMI) 32.0-32.9, adult: Secondary | ICD-10-CM

## 2018-12-07 DIAGNOSIS — E669 Obesity, unspecified: Secondary | ICD-10-CM

## 2018-12-07 NOTE — Progress Notes (Addendum)
I connected with Donna Cox 12/07/18 at  2:10 PM EDT by telephone and verified that I am speaking with the correct person using two identifiers.   I discussed the limitations, risks, security and privacy concerns of performing an evaluation and management service by telephone and the availability of in person appointments. I also discussed with the patient that there may be a patient responsible charge related to this service. The patient expressed understanding and agreed to proceed.  The patient was at home I spoke with the patient from my workstation phone The names of people involved in this encounter were: Betsy Pries , and [name]   Gynecology Abnormal Uterine Bleeding Initial Evaluation   Chief Complaint:  Chief Complaint  Patient presents with  . Menstrual Problem  . Infertility    discuss restarting medication    History of Present Illness:    Paitient is a 28 y.o. G2P1001 who LMP was Patient's last menstrual period was 12/03/2018., presents today for a problem visit.  She reports over the last year following her miscarriage menstrual cycles had been fairly regular.  That pregnancy was conceived on letrozole.  However her last menstrual cycle was consistent with an anovulatory cycle occurring later then expected and lasting longer than usual with cessation and resumption.  She has noted some weight gain since last year.  Thyroid function has not been checked since we last examined it last year.    Previous evaluation: PCOS panel showing evidence of hypothyroidism but otherwise normal, normal Semen analysis, Cushing's work up negative 01/26/2017.   Prior Diagnosis: most likley PCOS spectrum disorder  LMP: Patient's last menstrual period was 12/03/2018.   Review of Systems: Review of Systems  All other systems reviewed and are negative.   Past Medical History:  Past Medical History:  Diagnosis Date  . Bipolar disorder ([place])   . Depression   . Thyroid disease    . Tobacco use     Past Surgical History:  History reviewed. No pertinent surgical history.  Obstetric History: G2P1001  Family History:  Family History  Problem Relation Age of Onset  . Mental illness Mother   . Diabetes Mother   . Thyroid disease Mother   . Heart disease Maternal Grandmother   . Heart disease Maternal Grandfather   . Cancer Maternal Grandfather        lung    Social History:  Social History   Socioeconomic History  . Marital status: Married    Spouse name: Not on file  . Number of children: Not on file  . Years of education: Not on file  . Highest education level: Not on file  Occupational History  . Not on file  Social Needs  . Financial resource strain: Not on file  . Food insecurity    Worry: Not on file    Inability: Not on file  . Transportation needs    Medical: Not on file    Non-medical: Not on file  Tobacco Use  . Smoking status: Former Smoker    Types: Cigarettes    Quit date: 06/23/2014    Years since quitting: 4.4  . Smokeless tobacco: Never Used  Substance and Sexual Activity  . Alcohol use: Yes    Comment: rare occasion  . Drug use: No  . Sexual activity: Yes    Birth control/protection: None  Lifestyle  . Physical activity    Days per week: Not on file    Minutes per session: Not on file  .  Stress: Not on file  Relationships  . Social Herbalist on phone: Not on file    Gets together: Not on file    Attends religious service: Not on file    Active member of club or organization: Not on file    Attends meetings of clubs or organizations: Not on file    Relationship status: Not on file  . Intimate partner violence    Fear of current or ex partner: Not on file    Emotionally abused: Not on file    Physically abused: Not on file    Forced sexual activity: Not on file  Other Topics Concern  . Not on file  Social History Narrative  . Not on file    Allergies:  No Known Allergies  Medications: Prior to  Admission medications   Medication Sig Start Date End Date Taking? Authorizing Provider  levothyroxine (SYNTHROID, LEVOTHROID) 75 MCG tablet Take 1 tablet (75 mcg total) by mouth daily. 04/26/18  Yes [name], MD  Prenatal Vit-Fe Fumarate-FA (PRENATAL VITAMIN PO) Take 1 Dose by mouth daily.   Yes [provider]  terbinafine (LAMISIL) 250 MG tablet Take 1 tablet (250 mg total) by mouth daily. 11/29/18  Yes Volney American, PA-C    Physical Exam Height 5\' 8"  (1.727 m), weight 215 lb (97.5 kg), last menstrual period 12/03/2018, unknown if currently breastfeeding. Body mass index is 32.69 kg/m.  Patient's last menstrual period was 12/03/2018.  No physical exam as this was a remote telephone visit to promote social distancing during the current COVID-19 Pandemic   Assessment: 28 y.o. G2P1001 with abnormal uterine bleeding  Plan: Problem List Items Addressed This Visit    None    Visit Diagnoses    Female infertility associated with anovulation    -  Primary   Class 1 obesity without serious comorbidity with body mass index (BMI) of 32.0 to 32.9 in adult, unspecified obesity type          1) AUB - Patient reports menstrual cycle has become more irregular again.  Is interested in trying to achieve again.  Did manage to conceive on letrazole start of last year but miscarried.  Instructed to contact me with start of next menstrual cycle of if no menses by day 35 for provera course.  Will plan to start letrozole at 5mg  po daily - recheck PCOS panel and thyroid panel at time of progestin lab draw  2) Telephone time 15:42 minutes  2) No follow-ups on file.   [name], MD, [facility], [facility] 12/07/2018, 7:38 PM

## 2019-01-21 ENCOUNTER — Ambulatory Visit: Payer: Self-pay | Admitting: *Deleted

## 2019-01-21 NOTE — Telephone Encounter (Signed)
No urine output in approximately 36 hours. Stated she was outside all day now feeling dizzy with edematous legs. Advised ER at this time.Her husband will drive her now.  Answer Assessment - Initial Assessment Questions 1. SYMPTOM: "What's the main symptom you're concerned about?" (e.g., frequency, incontinence)    Reports no urine in approximately 36 hours.  2. ONSET: "When did the  *No Answer*  start?"     3. PAIN: "Is there any pain?" If so, ask: "How bad is it?" (Scale: 1-10; mild, moderate, severe)      4. CAUSE: "What do you think is causing the symptoms?"      5. OTHER SYMPTOMS: "Do you have any other symptoms?" (e.g., fever, flank pain, blood in urine, pain with urination)      6. PREGNANCY: "Is there any chance you are pregnant?" "When was your last menstrual period?"  Protocols used: URINARY [facility]

## 2019-01-24 ENCOUNTER — Other Ambulatory Visit: Payer: Self-pay

## 2019-01-24 ENCOUNTER — Ambulatory Visit (INDEPENDENT_AMBULATORY_CARE_PROVIDER_SITE_OTHER): Payer: BLUE CROSS/BLUE SHIELD | Admitting: Family Medicine

## 2019-01-24 ENCOUNTER — Encounter: Payer: Self-pay | Admitting: Family Medicine

## 2019-01-24 DIAGNOSIS — R55 Syncope and collapse: Secondary | ICD-10-CM

## 2019-01-24 DIAGNOSIS — R42 Dizziness and giddiness: Secondary | ICD-10-CM | POA: Diagnosis not present

## 2019-01-24 DIAGNOSIS — R34 Anuria and oliguria: Secondary | ICD-10-CM

## 2019-01-24 NOTE — Telephone Encounter (Signed)
I couldn't find a local ER encounter, please check on her and see if she's still having sxs

## 2019-01-24 NOTE — Telephone Encounter (Signed)
Called and left a VM asking patient to return my call. 

## 2019-01-24 NOTE — Telephone Encounter (Signed)
Patient did not go to the ER, appt scheduled at 1:00pm today.

## 2019-01-24 NOTE — Progress Notes (Signed)
There were no vitals taken for this visit.   Subjective:    Patient ID: Donna Cox, female    DOB: [birth year], 28 y.o.   MRN: [id number]  HPI: Donna Cox is a 28 y.o. female  Chief Complaint  Patient presents with   Decreased Urine Output   Loss of Consciousness     This visit was completed via WebEx due to the restrictions of the COVID-19 pandemic. All issues as above were discussed and addressed. Physical exam was done as above through visual confirmation on WebEx. If it was felt that the patient should be evaluated in the office, they were directed there. The patient verbally consented to this visit.  Location of the patient: in car  Location of the provider: home  Those involved with this call:   Provider: Merrie Roof, PA-C  CMA: Merilyn Baba, [place] Desk/Registration: Jill Side   Time spent on call: 15 minutes with patient face to face via video conference. More than 50% of this time was spent in counseling and coordination of care. 5 minutes total spent in review of patient's record and preparation of their chart. I verified patient identity using two factors (patient name and date of birth). Patient consents verbally to being seen via telemedicine visit today.   Patient presenting with several days of decreased urination, dizziness, racing heart, and an episode of syncope that occurred while at school 2 days ago. States prior to onset of all this she was doing some intense yardwork outside for 2-3 days straight and drank almost nothing the entire time. Otherwise, no major changes to her lifestyle, medications, exposures and no hx of this happening in the past. Still having pounding headache with standing, and sudden movements at times will make her dizzy. Only urinating about 3 times per day now but trying to re-hydrate herself as much as possible. Denies N/V/D, CP, SOB, diaphoresis, edema. Adamantly declines going to the ER per nurse triage or our  recommendations.   Relevant past medical, surgical, family and social history reviewed and updated as indicated. Interim medical history since our last visit reviewed. Allergies and medications reviewed and updated.  Review of Systems  Per HPI unless specifically indicated above     Objective:    There were no vitals taken for this visit.  Wt Readings from Last 3 Encounters:  12/07/18 215 lb (97.5 kg)  10/27/18 225 lb (102.1 kg)  07/07/18 225 lb (102.1 kg)    Physical Exam Vitals signs and nursing note reviewed.  Constitutional:      General: She is not in acute distress.    Appearance: Normal appearance.  HENT:     Head: Atraumatic.     Right Ear: External ear normal.     Left Ear: External ear normal.     Nose: Nose normal. No congestion.     Mouth/Throat:     Mouth: Mucous membranes are moist.     Pharynx: Oropharynx is clear. No posterior oropharyngeal erythema.  Eyes:     Extraocular Movements: Extraocular movements intact.     Conjunctiva/sclera: Conjunctivae normal.  Neck:     Musculoskeletal: Normal range of motion.  Cardiovascular:     Comments: Unable to assess via virtual visit Pulmonary:     Effort: Pulmonary effort is normal. No respiratory distress.  Musculoskeletal: Normal range of motion.  Skin:    General: Skin is dry.     Findings: No erythema.  Neurological:     Mental Status: She is  alert and oriented to person, place, and time.  Psychiatric:        Mood and Affect: Mood normal.        Thought Content: Thought content normal.        Judgment: Judgment normal.     Results for orders placed or performed in visit on 07/07/18  Microscopic Examination   URINE  Result Value Ref Range   WBC, UA None seen 0 - 5 /hpf   RBC, UA 0-2 0 - 2 /hpf   Epithelial Cells (non renal) 0-10 0 - 10 /hpf   Bacteria, UA Few (A) None seen/Few  Pregnancy, urine  Result Value Ref Range   Preg Test, Ur Negative Negative  Beta hCG quant (ref lab)  Result Value  Ref Range   hCG Quant <1 mIU/mL  UA/M w/rflx Culture, Routine   Specimen: Urine   URINE  Result Value Ref Range   Specific Gravity, UA 1.020 1.005 - 1.030   pH, UA 7.5 5.0 - 7.5   Color, UA Yellow Yellow   Appearance Ur Clear Clear   Leukocytes, UA Negative Negative   Protein, UA Negative Negative/Trace   Glucose, UA Negative Negative   Ketones, UA Negative Negative   RBC, UA Trace (A) Negative   Bilirubin, UA Negative Negative   Urobilinogen, Ur 0.2 0.2 - 1.0 mg/dL   Nitrite, UA Negative Negative   Microscopic Examination See below:       Assessment & Plan:   Problem List Items Addressed This Visit    None    Visit Diagnoses    Syncope, unspecified syncope type    -  Primary   Dizziness       Decreased urine output        Unable to take her own vitals today due to lack of equipment, but slowly improving symptomatically with re-hydration. Suspect her sxs related to significant dehydration but again recommended at minimum an in person evaluation immediately. Patient declines and wishes to give sxs several more days and she will come in by Wednesday for further testing and exam if not resolved. Agreeable to going to ER if sxs acutely worsen in meantime.  Rest, rehydration with plain water and electrolyte solution, and staying in air conditioned areas recommended.   Follow up plan: Return in about 2 days (around 01/26/2019) for In person evaluation if not improving.

## 2019-01-31 ENCOUNTER — Ambulatory Visit: Payer: BLUE CROSS/BLUE SHIELD | Admitting: Certified Nurse Midwife

## 2019-01-31 ENCOUNTER — Other Ambulatory Visit: Payer: Self-pay

## 2019-01-31 ENCOUNTER — Encounter: Payer: Self-pay | Admitting: Certified Nurse Midwife

## 2019-01-31 VITALS — BP 121/68 | HR 93 | Ht 68.0 in | Wt 234.1 lb

## 2019-01-31 DIAGNOSIS — E039 Hypothyroidism, unspecified: Secondary | ICD-10-CM | POA: Diagnosis not present

## 2019-01-31 DIAGNOSIS — N970 Female infertility associated with anovulation: Secondary | ICD-10-CM

## 2019-01-31 MED ORDER — MEDROXYPROGESTERONE ACETATE 10 MG PO TABS: 10.0000 mg | ORAL_TABLET | Freq: Every day | ORAL | 0 refills | Status: DC

## 2019-01-31 MED ORDER — LETROZOLE 2.5 MG PO TABS: 5.0000 mg | ORAL_TABLET | Freq: Every day | ORAL | 0 refills | Status: AC

## 2019-01-31 NOTE — Progress Notes (Signed)
GYN ENCOUNTER NOTE  Subjective:       Donna Cox is a 28 y.o. [phone] female is here for gynecologic evaluation of the following issues:  1. Infertility, [place] was being followed by [facility] . She recently had a change in her insurance and can no longer see them. She has history of PCOS and has been trying to conceive. Conceived last year on  letrozole but had a miscarrage around 11 wks. She would like to try letrozole therapy again.    Gynecologic History Patient's last menstrual period was 12/02/2018 (exact date). Contraception: none  Obstetric History OB History  Gravida Para Term Preterm AB Living  _0 SAB TAB Ectopic Multiple Live Births  2       1    # Outcome Date GA Lbr Len/2nd Weight Sex Delivery Anes PTL Lv  3 SAB 2018          2 SAB 2016          1 Term 05/16/12 [redacted]w[redacted]d 7 lb 1 oz (3.204 kg) F Vag-Spont  N LIV    Past Medical History:  Diagnosis Date  . Bipolar disorder ([place]   . Depression   . Thyroid disease   . Tobacco use     History reviewed. No pertinent surgical history.  Current Outpatient Medications on File Prior to Visit  Medication Sig Dispense Refill  . levothyroxine (SYNTHROID, LEVOTHROID) 75 MCG tablet Take 1 tablet (75 mcg total) by mouth daily. 90 tablet 3  . Prenatal Vit-Fe Fumarate-FA (PRENATAL VITAMIN PO) Take 1 Dose by mouth daily.    .Marland Kitchenterbinafine (LAMISIL) 250 MG tablet Take 1 tablet (250 mg total) by mouth daily. (Patient not taking: Reported on 01/31/2019) 14 tablet 0   No current facility-administered medications on file prior to visit.     No Known Allergies  Social History   Socioeconomic History  . Marital status: Married    Spouse name: Not on file  . Number of children: Not on file  . Years of education: Not on file  . Highest education level: Not on file  Occupational History  . Not on file  Social Needs  . Financial resource strain: Not on file  . Food insecurity    Worry: Not on file    Inability:  Not on file  . Transportation needs    Medical: Not on file    Non-medical: Not on file  Tobacco Use  . Smoking status: Former Smoker    Types: Cigarettes    Quit date: 06/23/2014    Years since quitting: 4.6  . Smokeless tobacco: Never Used  Substance and Sexual Activity  . Alcohol use: Yes    Comment: rare occasion  . Drug use: No  . Sexual activity: Yes    Birth control/protection: None  Lifestyle  . Physical activity    Days per week: Not on file    Minutes per session: Not on file  . Stress: Not on file  Relationships  . Social [place] phone: Not on file    Gets together: Not on file    Attends religious service: Not on file    Active member of club or organization: Not on file    Attends meetings of clubs or organizations: Not on file    Relationship status: Not on file  . Intimate partner violence    Fear of current or ex partner: Not on file  Emotionally abused: Not on file    Physically abused: Not on file    Forced sexual activity: Not on file  Other Topics Concern  . Not on file  Social History Narrative  . Not on file    Family History  Problem Relation Age of Onset  . Mental illness Mother   . Diabetes Mother   . Thyroid disease Mother   . Heart disease Maternal Grandmother   . Heart disease Maternal Grandfather   . Cancer Maternal Grandfather        lung    The following portions of the patient's history were reviewed and updated as appropriate: allergies, current medications, past family history, past medical history, past social history, past surgical history and problem list.  Review of Systems Review of Systems - Negative except as mentioned in HPI Review of Systems - General ROS: negative for - chills, fatigue, fever, hot flashes, malaise or night sweats Hematological and Lymphatic ROS: negative for - bleeding problems or swollen lymph nodes Gastrointestinal ROS: negative for - abdominal pain, blood in stools, change in bowel  habits and nausea/vomiting Musculoskeletal ROS: negative for - joint pain, muscle pain or muscular weakness Genito-Urinary ROS: negative for - change in menstrual cycle, dysmenorrhea, dyspareunia, dysuria, genital discharge, genital ulcers, hematuria, incontinence, irregular/heavy menses, nocturia or pelvic pain  Objective:   BP 121/68   Pulse 93   Ht _0  (1.727 m)   Wt 234 lb 2 oz (106.2 kg)   LMP 12/02/2018 (Exact Date)   BMI 35.60 kg/m  CONSTITUTIONAL: Well-developed, well-nourished female in no acute distress.  HENT:  Normocephalic, atraumatic.  NECK: Normal range of motion, supple, no masses.  Normal thyroid.  SKIN: Skin is warm and dry. No rash noted. Not diaphoretic. No erythema. No pallor. [place]: Alert and oriented to person, place, and time. PSYCHIATRIC: Normal mood and affect. Normal behavior. Normal judgment and thought content. CARDIOVASCULAR:Not Examined RESPIRATORY: Not Examined BREASTS: Not Examined ABDOMEN: Soft, non distended; Non tender.  No Organomegaly. PELVIC:not indicated MUSCULOSKELETAL: Normal range of motion. No tenderness.  No cyanosis, clubbing, or edema.  Assessment:   1. Hypothyroidism, unspecified type - Thyroid Panel With   2. History of Miscarriage   3. Infertility       Plan:  Pt would like to continue with plan from [facility]. She was going to do one more trial of Letrozole if unsuccessful with pregnancy then would like referral to [facility] infertility. She has not had a period since June discussed use of provera to induce period. She has a history of hypothyroid TSH panel today. Will call with result. She will hold off on starting medications until TSH reviewed. She will follow up for lab (progestin level) day 22 or 23 of her cycle. She verbalizes and agrees to plan of care.   I attest more than 50% of visit spent reviewing previous notes, discussing her history, discussing plan of care, reviewing medications risks/benefits, discussing lab  testing. Discussing timing of intercourse, and use of ovulation kit. Face to face time 20 min.   Philip Aspen, CNM

## 2019-01-31 NOTE — Patient Instructions (Signed)
Polycystic Ovarian Syndrome  Polycystic ovarian syndrome (PCOS) is a common hormonal disorder among women of reproductive age. In most women with PCOS, many small fluid-filled sacs (cysts) grow on the ovaries, and the cysts are not part of a normal menstrual cycle. PCOS can cause problems with your menstrual periods and make it difficult to get pregnant. It can also cause an increased risk of miscarriage with pregnancy. If it is not treated, PCOS can lead to serious health problems, such as diabetes and heart disease. What are the causes? The cause of PCOS is not known, but it may be the result of a combination of certain factors, such as:  Irregular menstrual cycle.  High levels of certain hormones (androgens).  Problems with the hormone that helps to control blood sugar (insulin resistance).  Certain genes. What increases the risk? This condition is more likely to develop in women who have a family history of PCOS. What are the signs or symptoms? Symptoms of PCOS may include:  Multiple ovarian cysts.  Infrequent periods or no periods.  Periods that are too frequent or too heavy.  Unpredictable periods.  Inability to get pregnant (infertility) because of not ovulating.  Increased growth of hair on the face, chest, stomach, back, thumbs, thighs, or toes.  Acne or oily skin. Acne may develop during adulthood, and it may not respond to treatment.  Pelvic pain.  Weight gain or obesity.  Patches of thickened and dark brown or black skin on the neck, arms, breasts, or thighs (acanthosis nigricans).  Excess hair growth on the face, chest, abdomen, or upper thighs (hirsutism). How is this diagnosed? This condition is diagnosed based on:  Your medical history.  A physical exam, including a pelvic exam. Your health care provider may look for areas of increased hair growth on your skin.  Tests, such as: ? Ultrasound. This may be used to examine the ovaries and the lining of the  uterus (endometrium) for cysts. ? Blood tests. These may be used to check levels of sugar (glucose), female hormone (testosterone), and female hormones (estrogen and progesterone) in your blood. How is this treated? There is no cure for PCOS, but treatment can help to manage symptoms and prevent more health problems from developing. Treatment varies depending on:  Your symptoms.  Whether you want to have a baby or whether you need birth control (contraception). Treatment may include nutrition and lifestyle changes along with:  Progesterone hormone to start a menstrual period.  Birth control pills to help you have regular menstrual periods.  Medicines to make you ovulate, if you want to get pregnant.  Medicine to reduce excessive hair growth.  Surgery, in severe cases. This may involve making small holes in one or both of your ovaries. This decreases the amount of testosterone that your body produces. Follow these instructions at home:  Take over-the-counter and prescription medicines only as told by your health care provider.  Follow a healthy meal plan. This can help you reduce the effects of PCOS. ? Eat a healthy diet that includes lean proteins, complex carbohydrates, fresh fruits and vegetables, low-fat dairy products, and healthy fats. Make sure to eat enough fiber.  If you are overweight, lose weight as told by your health care provider. ? Losing 10% of your body weight may improve symptoms. ? Your health care provider can determine how much weight loss is best for you and can help you lose weight safely.  Keep all follow-up visits as told by your health care provider.  This is important. Contact a health care provider if:  Your symptoms do not get better with medicine.  You develop new symptoms. This information is not intended to replace advice given to you by your health care provider. Make sure you discuss any questions you have with your health care provider. Document  Released: 10/03/2004 Document Revised: 05/22/2017 Document Reviewed: 11/25/2015 Elsevier Patient Education  2020 [employer].

## 2019-02-01 LAB — THYROID PANEL WITH TSH
Free Thyroxine Index: 1.6 (ref 1.2–4.9)
T3 Uptake Ratio: 25 % (ref 24–39)
T4, Total: 6.4 ug/dL (ref 4.5–12.0)
TSH: 4.43 u[IU]/mL (ref 0.450–4.500)

## 2019-03-02 ENCOUNTER — Telehealth: Payer: Self-pay | Admitting: Family Medicine

## 2019-03-02 NOTE — Telephone Encounter (Signed)
I can't speak to what was advised beyond the result message from her GYN provider that is available in epic, but per the message I saw it simply said her thyroid testing came back normal which I interpreted as her dose is adequate and to continue that regimen. She would need to clarify this with that provider, though  Copied from [place] [id number]. Topic: General - Other >> Mar 02, 2019 10:24 AM Leward Quan A wrote: Reason for CRM: Patient called to say that she was recently told by her GYN that she no longer needed to take the levothyroxine (SYNTHROID, LEVOTHROID) 75 MCG tablet because they say that her thyroid levels are fine. Per patient she say that she can tell the difference of not taking the medication and need the nurse to give her a call please to discuss her options. Patient can be reached at Ph# [phone]

## 2019-03-02 NOTE — Telephone Encounter (Signed)
Patient confused with instructions as she had been out of the medication for about a month. Patient is requesting to have tsh rechecked. Appt scheduled to further discuss this.

## 2019-03-03 ENCOUNTER — Telehealth: Payer: Self-pay | Admitting: Family Medicine

## 2019-03-03 NOTE — Telephone Encounter (Signed)
Unfortunately no, due to policy  Copied from [place] [id number]. Topic: General - Inquiry >> Mar 03, 2019 10:16 AM Scherrie Gerlach wrote: Reason for CRM: pt states she has been directly exposed to someone that has tested positive for covid.  Pt has appt tomorrow, and wants to know since she does not have any sx, is it ok for her to wear double mask and come in? She does not understand why she cannot keep her appt if she has no sx??

## 2019-03-03 NOTE — Telephone Encounter (Signed)
Contacted pt to let her know that she can not come into office tomorrow. Offered to set up virtual due to exposure, pt is saying that she is going to go tomorrow to be tested. She is wanting to know that since she was just exposed yesterday would the test be accurate if she went tomorrow. Please advise.

## 2019-03-03 NOTE — Telephone Encounter (Signed)
The tests will not always pick up an infection that soon however many are being tested directly after exposures

## 2019-03-04 ENCOUNTER — Ambulatory Visit: Payer: Self-pay | Admitting: Family Medicine

## 2019-03-04 NOTE — Telephone Encounter (Signed)
Patient notified

## 2019-03-10 ENCOUNTER — Ambulatory Visit (INDEPENDENT_AMBULATORY_CARE_PROVIDER_SITE_OTHER): Payer: BLUE CROSS/BLUE SHIELD | Admitting: Unknown Physician Specialty

## 2019-03-10 ENCOUNTER — Encounter: Payer: Self-pay | Admitting: Unknown Physician Specialty

## 2019-03-10 ENCOUNTER — Other Ambulatory Visit: Payer: Self-pay

## 2019-03-10 DIAGNOSIS — J020 Streptococcal pharyngitis: Secondary | ICD-10-CM | POA: Diagnosis not present

## 2019-03-10 MED ORDER — AMOXICILLIN 875 MG PO TABS: 875.0000 mg | ORAL_TABLET | Freq: Two times a day (BID) | ORAL | 0 refills | Status: DC

## 2019-03-10 NOTE — Progress Notes (Signed)
There were no vitals taken for this visit.   Subjective:    Patient ID: Donna Cox, female    DOB: 1991-04-26, 28 y.o.   MRN: [id number]  HPI: Donna Cox is a 28 y.o. female  Chief Complaint  Patient presents with  . Sore Throat    pt states she has had a sever sore throat since Monday, states she feels really bad    . This visit was completed via FaceTime due to the restrictions of the COVID-19 pandemic. All issues as above were discussed and addressed. Physical exam was done as above through visual confirmation on Facetime. If it was felt that the patient should be evaluated in the office, they were directed there. The patient verbally consented to this visit. . Location of the patient: home . Location of the provider: home . Those involved with this call:  . Provider: Kathrine Haddock, DNP . CMA: Yvonna Alanis, CMA . Front Desk/Registration: [facility]  . Time spent on call: 10 minutes with patient face to face via video conference. More than 50% of this time was spent in counseling and coordination of care. 10 minutes total spent in review of patient's record and preparation of their chart.   Pt states she has a sore throat for the last 4 days.    Sore Throat  This is a new problem. Episode onset: 4 days. The problem has been rapidly worsening. Neither side of throat is experiencing more pain than the other. The maximum temperature recorded prior to her arrival was 101 - 101.9 F. The fever has been present for less than 1 day. Associated symptoms include congestion, ear pain, headaches and trouble swallowing. Pertinent negatives include no coughing, diarrhea, neck pain or shortness of breath. She has had exposure to strep. Treatments tried: cold and flu. The treatment provided no relief.     Relevant past medical, surgical, family and social history reviewed and updated as indicated. Interim medical history since our last visit reviewed. Allergies and medications reviewed  and updated.  Review of Systems  HENT: Positive for congestion, ear pain and trouble swallowing.   Respiratory: Negative for cough and shortness of breath.   Gastrointestinal: Negative for diarrhea.  Musculoskeletal: Negative for neck pain.  Neurological: Positive for headaches.    Per HPI unless specifically indicated above     Objective:    There were no vitals taken for this visit.  Wt Readings from Last 3 Encounters:  01/31/19 234 lb 2 oz (106.2 kg)  12/07/18 215 lb (97.5 kg)  10/27/18 225 lb (102.1 kg)    Physical Exam Constitutional:      General: She is not in acute distress.    Appearance: Normal appearance. She is well-developed.  HENT:     Head: Normocephalic and atraumatic.     Nose: No rhinorrhea.     Right Sinus: No maxillary sinus tenderness or frontal sinus tenderness.     Left Sinus: No maxillary sinus tenderness or frontal sinus tenderness.     Mouth/Throat:     Mouth: Mucous membranes are moist.     Comments: No flashlight available Eyes:     General: Lids are normal. No scleral icterus.       Right eye: No discharge.        Left eye: No discharge.     Conjunctiva/sclera: Conjunctivae normal.  Neck:     Comments: Bilateral tenderness and swelling Cardiovascular:     Comments: Pt reports steady radial pulse Pulmonary:  Effort: Pulmonary effort is normal. No respiratory distress.  Abdominal:     Palpations: There is no hepatomegaly or splenomegaly.  Musculoskeletal: Normal range of motion.  Skin:    Coloration: Skin is not pale.     Findings: No erythema or rash.  Neurological:     Mental Status: She is alert and oriented to person, place, and time.  Psychiatric:        Behavior: Behavior normal.        Thought Content: Thought content normal.        Judgment: Judgment normal.     Results for orders placed or performed in visit on 01/31/19  Thyroid Panel With TSH  Result Value Ref Range   TSH 4.430 0.450 - 4.500 uIU/mL   T4, Total 6.4  4.5 - 12.0 ug/dL   T3 Uptake Ratio 25 24 - 39 %   Free Thyroxine Index 1.6 1.2 - 4.9      Assessment & Plan:   Problem List Items Addressed This Visit    None    Visit Diagnoses    Strep pharyngitis    -  Primary     Rx for Amoxil 875 mg TID for 10 days.  Instructed to let us know if she is getting worse or not better   Follow up plan:

## 2019-03-10 NOTE — Patient Instructions (Signed)
Strep Throat, Adult Strep throat is an infection of the throat. It is caused by germs (bacteria). Strep throat is common during the cold months of the year. It mostly affects children who are 68-26 years old. However, people of all ages can get it at any time of the year. When strep throat affects the tonsils, it is called tonsillitis. When it affects the back of the throat, it is called pharyngitis. This infection spreads from person to person through coughing, sneezing, or having close contact. What are the causes? This condition is caused by the Streptococcus pyogenes germ. What increases the risk? You are more likely to develop this condition if:  You care for young children. Children are more likely to get strep throat and may spread it to others.  You go to crowded places. Germs can spread easily in such places.  You kiss or touch someone who has strep throat. What are the signs or symptoms? Symptoms of this condition include:  Fever or chills.  Redness, swelling, or pain in the tonsils or throat.  Pain or trouble when swallowing.  White or yellow spots on the tonsils or throat.  Tender glands in the neck and under the jaw.  Bad breath.  Red rash all over the body. This is rare. How is this treated? This condition may be treated with:  Medicines that kill germs (antibiotics).  Medicines that treat pain or fever. These include: ? Ibuprofen or acetaminophen. ? Aspirin, only for patients who are over the age of 71. ? Throat lozenges. ? Throat sprays. Follow these instructions at home: Medicines   Take over-the-counter and prescription medicines only as told by your doctor.  Take your antibiotic medicine as told by your doctor. Do not stop taking the antibiotic even if you start to feel better. Eating and drinking   If you have trouble swallowing, eat soft foods until your throat feels better.  Drink enough fluid to keep your pee (urine) pale yellow.  To help  with pain, you may have: ? Warm fluids, such as soup and tea. ? Cold fluids, such as frozen desserts or popsicles. General instructions  Rinse your mouth (gargle) with a salt-water mixture 3-4 times a day or as needed. To make a salt-water mixture, dissolve -1 tsp (3-6 g) of salt in 1 cup (237 mL) of warm water.  Rest as much as you can.  Stay home from work or school until you have been taking antibiotics for 24 hours.  Avoid smoking or being around people who smoke.  Keep all follow-up visits as told by your doctor. This is important. How is this prevented?   Do not share food, drinking cups, or personal items. They can cause the germs to spread.  Wash your hands well with soap and water. Make sure that all people in your house wash their hands well.  Have family members tested if they have a fever or a sore throat. They may need an antibiotic if they have strep throat. Contact a doctor if:  You have swelling in your neck that keeps getting bigger.  You get a rash, cough, or earache.  You cough up a thick fluid that is green, yellow-brown, or bloody.  You have pain that does not get better with medicine.  Your symptoms get worse instead of getting better.  You have a fever. Get help right away if:  You vomit.  You have a very bad headache.  Your neck hurts or feels stiff.  You have  chest pain or are short of breath.  You have drooling, very bad throat pain, or changes in your voice.  Your neck is swollen, or the skin gets red and tender.  Your mouth is dry, or you are peeing less than normal.  You keep feeling more tired or have trouble waking up.  Your joints are red or painful. Summary  Strep throat is an infection of the throat. It is caused by germs (bacteria).  This infection can spread from person to person through coughing, sneezing, or having close contact.  Take your medicines, including antibiotics, as told by your doctor. Do not stop taking  the antibiotic even if you start to feel better.  To prevent the spread of germs, wash your hands well with soap and water. Have others do the same. Do not share food, drinking cups, or personal items.  Get help right away if you have a bad headache, chest pain, shortness of breath, a stiff or painful neck, or you vomit. This information is not intended to replace advice given to you by your health care provider. Make sure you discuss any questions you have with your health care provider. Document Released: 11/26/2007 Document Revised: 08/27/2018 Document Reviewed: 08/27/2018 Elsevier Patient Education  [place].  Strep Throat, Adult Strep throat is an infection of the throat. It is caused by germs (bacteria). Strep throat is common during the cold months of the year. It mostly affects children who are 30-19 years old. However, people of all ages can get it at any time of the year. When strep throat affects the tonsils, it is called tonsillitis. When it affects the back of the throat, it is called pharyngitis. This infection spreads from person to person through coughing, sneezing, or having close contact. What are the causes? This condition is caused by the Streptococcus pyogenes germ. What increases the risk? You are more likely to develop this condition if:  You care for young children. Children are more likely to get strep throat and may spread it to others.  You go to crowded places. Germs can spread easily in such places.  You kiss or touch someone who has strep throat. What are the signs or symptoms? Symptoms of this condition include:  Fever or chills.  Redness, swelling, or pain in the tonsils or throat.  Pain or trouble when swallowing.  White or yellow spots on the tonsils or throat.  Tender glands in the neck and under the jaw.  Bad breath.  Red rash all over the body. This is rare. How is this treated? This condition may be treated with:  Medicines that  kill germs (antibiotics).  Medicines that treat pain or fever. These include: ? Ibuprofen or acetaminophen. ? Aspirin, only for patients who are over the age of 23. ? Throat lozenges. ? Throat sprays. Follow these instructions at home: Medicines   Take over-the-counter and prescription medicines only as told by your doctor.  Take your antibiotic medicine as told by your doctor. Do not stop taking the antibiotic even if you start to feel better. Eating and drinking   If you have trouble swallowing, eat soft foods until your throat feels better.  Drink enough fluid to keep your pee (urine) pale yellow.  To help with pain, you may have: ? Warm fluids, such as soup and tea. ? Cold fluids, such as frozen desserts or popsicles. General instructions  Rinse your mouth (gargle) with a salt-water mixture 3-4 times a day or as needed.  To make a salt-water mixture, dissolve -1 tsp (3-6 g) of salt in 1 cup (237 mL) of warm water.  Rest as much as you can.  Stay home from work or school until you have been taking antibiotics for 24 hours.  Avoid smoking or being around people who smoke.  Keep all follow-up visits as told by your doctor. This is important. How is this prevented?   Do not share food, drinking cups, or personal items. They can cause the germs to spread.  Wash your hands well with soap and water. Make sure that all people in your house wash their hands well.  Have family members tested if they have a fever or a sore throat. They may need an antibiotic if they have strep throat. Contact a doctor if:  You have swelling in your neck that keeps getting bigger.  You get a rash, cough, or earache.  You cough up a thick fluid that is green, yellow-brown, or bloody.  You have pain that does not get better with medicine.  Your symptoms get worse instead of getting better.  You have a fever. Get help right away if:  You vomit.  You have a very bad headache.  Your  neck hurts or feels stiff.  You have chest pain or are short of breath.  You have drooling, very bad throat pain, or changes in your voice.  Your neck is swollen, or the skin gets red and tender.  Your mouth is dry, or you are peeing less than normal.  You keep feeling more tired or have trouble waking up.  Your joints are red or painful. Summary  Strep throat is an infection of the throat. It is caused by germs (bacteria).  This infection can spread from person to person through coughing, sneezing, or having close contact.  Take your medicines, including antibiotics, as told by your doctor. Do not stop taking the antibiotic even if you start to feel better.  To prevent the spread of germs, wash your hands well with soap and water. Have others do the same. Do not share food, drinking cups, or personal items.  Get help right away if you have a bad headache, chest pain, shortness of breath, a stiff or painful neck, or you vomit. This information is not intended to replace advice given to you by your health care provider. Make sure you discuss any questions you have with your health care provider. Document Released: 11/26/2007 Document Revised: 08/27/2018 Document Reviewed: 08/27/2018 Elsevier Patient Education  [place].  Strep Throat, Adult Strep throat is an infection of the throat. It is caused by germs (bacteria). Strep throat is common during the cold months of the year. It mostly affects children who are 72-65 years old. However, people of all ages can get it at any time of the year. When strep throat affects the tonsils, it is called tonsillitis. When it affects the back of the throat, it is called pharyngitis. This infection spreads from person to person through coughing, sneezing, or having close contact. What are the causes? This condition is caused by the Streptococcus pyogenes germ. What increases the risk? You are more likely to develop this condition if:  You  care for young children. Children are more likely to get strep throat and may spread it to others.  You go to crowded places. Germs can spread easily in such places.  You kiss or touch someone who has strep throat. What are the signs or symptoms? Symptoms  of this condition include:  Fever or chills.  Redness, swelling, or pain in the tonsils or throat.  Pain or trouble when swallowing.  White or yellow spots on the tonsils or throat.  Tender glands in the neck and under the jaw.  Bad breath.  Red rash all over the body. This is rare. How is this treated? This condition may be treated with:  Medicines that kill germs (antibiotics).  Medicines that treat pain or fever. These include: ? Ibuprofen or acetaminophen. ? Aspirin, only for patients who are over the age of 82. ? Throat lozenges. ? Throat sprays. Follow these instructions at home: Medicines   Take over-the-counter and prescription medicines only as told by your doctor.  Take your antibiotic medicine as told by your doctor. Do not stop taking the antibiotic even if you start to feel better. Eating and drinking   If you have trouble swallowing, eat soft foods until your throat feels better.  Drink enough fluid to keep your pee (urine) pale yellow.  To help with pain, you may have: ? Warm fluids, such as soup and tea. ? Cold fluids, such as frozen desserts or popsicles. General instructions  Rinse your mouth (gargle) with a salt-water mixture 3-4 times a day or as needed. To make a salt-water mixture, dissolve -1 tsp (3-6 g) of salt in 1 cup (237 mL) of warm water.  Rest as much as you can.  Stay home from work or school until you have been taking antibiotics for 24 hours.  Avoid smoking or being around people who smoke.  Keep all follow-up visits as told by your doctor. This is important. How is this prevented?   Do not share food, drinking cups, or personal items. They can cause the germs to  spread.  Wash your hands well with soap and water. Make sure that all people in your house wash their hands well.  Have family members tested if they have a fever or a sore throat. They may need an antibiotic if they have strep throat. Contact a doctor if:  You have swelling in your neck that keeps getting bigger.  You get a rash, cough, or earache.  You cough up a thick fluid that is green, yellow-brown, or bloody.  You have pain that does not get better with medicine.  Your symptoms get worse instead of getting better.  You have a fever. Get help right away if:  You vomit.  You have a very bad headache.  Your neck hurts or feels stiff.  You have chest pain or are short of breath.  You have drooling, very bad throat pain, or changes in your voice.  Your neck is swollen, or the skin gets red and tender.  Your mouth is dry, or you are peeing less than normal.  You keep feeling more tired or have trouble waking up.  Your joints are red or painful. Summary  Strep throat is an infection of the throat. It is caused by germs (bacteria).  This infection can spread from person to person through coughing, sneezing, or having close contact.  Take your medicines, including antibiotics, as told by your doctor. Do not stop taking the antibiotic even if you start to feel better.  To prevent the spread of germs, wash your hands well with soap and water. Have others do the same. Do not share food, drinking cups, or personal items.  Get help right away if you have a bad headache, chest pain, shortness of breath,  a stiff or painful neck, or you vomit. This information is not intended to replace advice given to you by your health care provider. Make sure you discuss any questions you have with your health care provider. Document Released: 11/26/2007 Document Revised: 08/27/2018 Document Reviewed: 08/27/2018 Elsevier Patient Education  [place].

## 2019-03-25 ENCOUNTER — Ambulatory Visit: Payer: Self-pay | Admitting: Family Medicine

## 2019-05-18 ENCOUNTER — Ambulatory Visit: Payer: Self-pay | Admitting: *Deleted

## 2019-05-18 NOTE — Telephone Encounter (Signed)
Agree with ER eval.

## 2019-05-18 NOTE — Telephone Encounter (Addendum)
Pt called stating she was bitten by a deer last night on her right; the skin was broken; the area is the size of "2 tooth"; the is bruising around the area (1 inch); the affected area did bleed initially, but stopped about 20 min later; the injury occurred 05/17/2019 around 1900; recommendations made per nurse triage protocol; she verbalized understanding; the pt sees Merrie Roof, [facility]; will route to office for notification.  Reason for Disposition . [1] Any break in skin from BITE (e.g., cut, puncture or scratch) AND[2] WILD animal at risk for RABIES (e.g., bat, raccoon, fox, skunk, coyote, other carnivores)  Answer Assessment - Initial Assessment Questions 1. ANIMAL: "What type of animal caused the bite?" "Is the injury from a bite or a claw?" If the animal is a dog or a cat, ask: "Was it a pet or a stray?" "Was it acting ill or behaving strangely?"     Deer; pt was feeding deer at [employer] 2. LOCATION: "Where is the bite located?"      Right hand 3. SIZE: "How big is the bite?" "What does it look like?"     "2 teeth"; area bruised 4. ONSET: "When did the bite happen?" (Minutes or hours ago)      05/17/2019 around 1900 5. CIRCUMSTANCES: "Tell me how this happened."     Pt was feeding deer 6. TETANUS: "When was the last tetanus booster?"    Not sure 7. PREGNANCY: "Is there any chance you are pregnant?" "When was your last menstrual period?"     Yes, middle of October 2020  Protocols used: ANIMAL BITE-A-AH

## 2019-05-20 ENCOUNTER — Ambulatory Visit: Payer: Self-pay | Admitting: Family Medicine

## 2019-06-10 ENCOUNTER — Telehealth: Payer: Self-pay | Admitting: Certified Nurse Midwife

## 2019-06-10 NOTE — Telephone Encounter (Signed)
Called and spoke with patient.  Patient c/o lump under left breast "quarter size" x2 days, tender to touch, no redness.  Patient request appointment for today.  Patient advised that we have no openings today but can schedule an appointment for next week, patient declined and plans to go to ER or Urgent Care for further evaluation.

## 2019-06-10 NOTE — Telephone Encounter (Signed)
Pt called in pt said she found a lump under her left breast. Pt is requesting a call back from the nurse. Please advise.

## 2019-07-06 ENCOUNTER — Telehealth: Payer: Self-pay

## 2019-07-06 NOTE — Telephone Encounter (Signed)
Mychart message sent to patient.

## 2019-07-06 NOTE — Telephone Encounter (Signed)
Pt feels like she has enlarged vein/R.side of her vagina.. Patient says it's very painful. Please call patient

## 2019-07-26 ENCOUNTER — Ambulatory Visit: Payer: BLUE CROSS/BLUE SHIELD | Attending: Internal Medicine

## 2019-07-26 DIAGNOSIS — Z20822 Contact with and (suspected) exposure to covid-19: Secondary | ICD-10-CM

## 2019-07-27 LAB — NOVEL CORONAVIRUS, NAA: SARS-CoV-2, NAA: NOT DETECTED

## 2020-01-27 ENCOUNTER — Ambulatory Visit: Payer: Self-pay | Admitting: Advanced Practice Midwife

## 2020-01-27 ENCOUNTER — Ambulatory Visit: Payer: Self-pay | Admitting: Obstetrics and Gynecology

## 2020-01-30 ENCOUNTER — Other Ambulatory Visit: Payer: Self-pay | Admitting: Obstetrics and Gynecology

## 2020-01-30 ENCOUNTER — Telehealth: Payer: Self-pay | Admitting: Obstetrics and Gynecology

## 2020-01-30 DIAGNOSIS — N939 Abnormal uterine and vaginal bleeding, unspecified: Secondary | ICD-10-CM

## 2020-01-30 MED ORDER — MEDROXYPROGESTERONE ACETATE 10 MG PO TABS: ORAL_TABLET | ORAL | 0 refills | Status: DC

## 2020-01-30 NOTE — Telephone Encounter (Signed)
Patient left v/m and I returned the call. Patient was scheduled Friday, 01/27/20 and upon arrival found the appointment had been auto cancelled. Patient said she did not cancel appointment and needed to be seen but Dr. Georgianne Fick did not have another opening. Appointment w/ Opal Sidles was offered with plan to follow-up with Dr. Georgianne Fick afterwards, but patient decided not to go this route. Patient called back today to check for cancellations and is scheduled for 02/24/20. Advised patient I would make Dr. Georgianne Fick aware of her symptoms: passed out this morning, very weak, no energy, wants to sleep. Patient is afraid she is losing too much blood, stated she is buying tampons and pads every 2-3 days, has baseball-sized clots every 20-30 minutes. Patient said she is not driving at this point, does not feel safe.

## 2020-01-30 NOTE — Telephone Encounter (Signed)
-----   Message from [name], MD sent at 01/30/2020  5:09 PM EDT ----- Regarding: outpatient labs Need labs tomorrow 8/9

## 2020-01-30 NOTE — Telephone Encounter (Signed)
Called and left voicemail for patient to call back to be scheduled. 

## 2020-01-30 NOTE — Telephone Encounter (Signed)
Lmtrc on mobile #. Home # is incorrect.

## 2020-01-30 NOTE — Progress Notes (Signed)
Heavy bleeding, lightheaded and dizzy.  Advised to go to ED for evaluation of need of transfusion.  Patient would like to try provera course and outpatient labs first.

## 2020-01-30 NOTE — Telephone Encounter (Signed)
Needs to go to ED

## 2020-01-31 ENCOUNTER — Other Ambulatory Visit: Payer: 59

## 2020-01-31 ENCOUNTER — Other Ambulatory Visit: Payer: Self-pay

## 2020-01-31 DIAGNOSIS — N939 Abnormal uterine and vaginal bleeding, unspecified: Secondary | ICD-10-CM

## 2020-01-31 LAB — IRON AND TIBC
Iron Saturation: 12 % — ABNORMAL LOW (ref 15–55)
Iron: 40 ug/dL (ref 27–159)
Total Iron Binding Capacity: 338 ug/dL (ref 250–450)
UIBC: 298 ug/dL (ref 131–425)

## 2020-01-31 LAB — CBC
Hematocrit: 34.8 % (ref 34.0–46.6)
Hemoglobin: 11.9 g/dL (ref 11.1–15.9)
MCH: 29.7 pg (ref 26.6–33.0)
MCHC: 34.2 g/dL (ref 31.5–35.7)
MCV: 87 fL (ref 79–97)
Platelets: 289 10*3/uL (ref 150–450)
RBC: 4.01 x10E6/uL (ref 3.77–5.28)
RDW: 13.4 % (ref 11.7–15.4)
WBC: 6.3 10*3/uL (ref 3.4–10.8)

## 2020-01-31 LAB — FERRITIN: Ferritin: 48 ng/mL (ref 15–150)

## 2020-01-31 NOTE — Telephone Encounter (Signed)
Called and left voicemail for patient to call back to be scheduled. 

## 2020-02-09 ENCOUNTER — Ambulatory Visit
Admission: EM | Admit: 2020-02-09 | Discharge: 2020-02-09 | Disposition: A | Discharge status: Home / Self Care | Payer: 59 | Attending: Family | Admitting: Family

## 2020-02-09 ENCOUNTER — Other Ambulatory Visit: Payer: Self-pay

## 2020-02-09 DIAGNOSIS — F319 Bipolar disorder, unspecified: Secondary | ICD-10-CM | POA: Diagnosis not present

## 2020-02-09 DIAGNOSIS — R0602 Shortness of breath: Secondary | ICD-10-CM | POA: Insufficient documentation

## 2020-02-09 DIAGNOSIS — Z87891 Personal history of nicotine dependence: Secondary | ICD-10-CM | POA: Insufficient documentation

## 2020-02-09 DIAGNOSIS — Z6837 Body mass index (BMI) 37.0-37.9, adult: Secondary | ICD-10-CM | POA: Insufficient documentation

## 2020-02-09 DIAGNOSIS — Z8349 Family history of other endocrine, nutritional and metabolic diseases: Secondary | ICD-10-CM | POA: Insufficient documentation

## 2020-02-09 DIAGNOSIS — R0981 Nasal congestion: Secondary | ICD-10-CM | POA: Diagnosis not present

## 2020-02-09 DIAGNOSIS — R110 Nausea: Secondary | ICD-10-CM | POA: Diagnosis not present

## 2020-02-09 DIAGNOSIS — J029 Acute pharyngitis, unspecified: Secondary | ICD-10-CM | POA: Insufficient documentation

## 2020-02-09 DIAGNOSIS — U071 COVID-19: Secondary | ICD-10-CM | POA: Insufficient documentation

## 2020-02-09 DIAGNOSIS — R509 Fever, unspecified: Secondary | ICD-10-CM | POA: Insufficient documentation

## 2020-02-09 DIAGNOSIS — Z801 Family history of malignant neoplasm of trachea, bronchus and lung: Secondary | ICD-10-CM | POA: Diagnosis not present

## 2020-02-09 DIAGNOSIS — E559 Vitamin D deficiency, unspecified: Secondary | ICD-10-CM | POA: Insufficient documentation

## 2020-02-09 DIAGNOSIS — R52 Pain, unspecified: Secondary | ICD-10-CM | POA: Diagnosis not present

## 2020-02-09 DIAGNOSIS — Z79890 Hormone replacement therapy (postmenopausal): Secondary | ICD-10-CM | POA: Diagnosis not present

## 2020-02-09 DIAGNOSIS — E039 Hypothyroidism, unspecified: Secondary | ICD-10-CM | POA: Diagnosis not present

## 2020-02-09 DIAGNOSIS — Z79899 Other long term (current) drug therapy: Secondary | ICD-10-CM | POA: Diagnosis not present

## 2020-02-09 DIAGNOSIS — Z7952 Long term (current) use of systemic steroids: Secondary | ICD-10-CM | POA: Insufficient documentation

## 2020-02-09 DIAGNOSIS — Z793 Long term (current) use of hormonal contraceptives: Secondary | ICD-10-CM | POA: Insufficient documentation

## 2020-02-09 DIAGNOSIS — R059 Cough, unspecified: Secondary | ICD-10-CM

## 2020-02-09 DIAGNOSIS — R05 Cough: Secondary | ICD-10-CM | POA: Diagnosis not present

## 2020-02-09 LAB — SARS CORONAVIRUS 2 (TAT 6-24 HRS): SARS Coronavirus 2: POSITIVE — AB

## 2020-02-09 MED ORDER — ALBUTEROL SULFATE HFA 108 (90 BASE) MCG/ACT IN AERS: 2.0000 | INHALATION_SPRAY | RESPIRATORY_TRACT | 1 refills | Status: DC | PRN

## 2020-02-09 MED ORDER — PREDNISONE 20 MG PO TABS: 40.0000 mg | ORAL_TABLET | Freq: Every day | ORAL | 0 refills | Status: AC

## 2020-02-09 NOTE — Discharge Instructions (Addendum)
Recommend start Prednisone 40mg  daily for 5 days then stop. May use Albuterol inhaler 2 puffs every 4 to 6 hours as needed for wheezing and shortness of breath. Continue to increase fluids to help loosen up mucus in sinuses and chest. May take OTC Aleve 2 tablets every 12 hours as needed for body aches- may also use Tylenol 1000mg  every 8 hours as needed for fever. Rest. Follow-up pending COVID 19 test results.

## 2020-02-09 NOTE — ED Provider Notes (Signed)
[facility]    CSN: [id number] Arrival date & time: 02/09/20  0843      History   Chief Complaint Chief Complaint  Patient presents with   Covid Exposure   Cough   Nasal Congestion   Shortness of Breath   Nausea   Wheezing   Generalized Body Aches   Sore Throat    HPI Donna Cox is a 29 y.o. female.   29 year old female presents with cough, body aches, chills, low grade fever, nasal congestion and sore throat for the past 2 to 3 days. Now also having shortness of breath, wheezing, loss of taste and smell, and decreased appetite. Denies any diarrhea lately. Has taken Advil, Mucinex, Delsym and Albuterol inhaler (older) with minimal relief. Almost 60 year old daughter first got sick 4 days ago but feeling better. Now her husband and almost 22 year old child is also sick. Was exposed to a family about 6 to 7 days ago who tested positive for COVID 19 3 days ago. Has not been vaccinated against COVID 19. Former smoker. Other chronic health issues include thyroid disorder and currently on Synthroid and Prenatal vitamins daily.   The history is provided by the patient.    Past Medical History:  Diagnosis Date   Bipolar disorder ([place])    Depression    Thyroid disease    Tobacco use     Patient Active Problem List   Diagnosis Date Noted   Abnormal weight gain 10/10/2016   Hypothyroid 10/10/2016   History of vitamin D deficiency 10/10/2016   Atypical nevi 01/23/2016    History reviewed. No pertinent surgical history.  OB History    Gravida  3   Para  1   Term  1   Preterm      AB  2   Living  1     SAB  2   TAB      Ectopic      Multiple      Live Births  1            Home Medications    Prior to Admission medications   Medication Sig Start Date End Date Taking? Authorizing Provider  levothyroxine (SYNTHROID, LEVOTHROID) 75 MCG tablet Take 1 tablet (75 mcg total) by mouth daily. 04/26/18  Yes [name], MD    Prenatal Vit-Fe Fumarate-FA (PRENATAL VITAMIN PO) Take 1 Dose by mouth daily.   Yes [provider]  albuterol (VENTOLIN HFA) 108 (90 Base) MCG/ACT inhaler Inhale 2 puffs into the lungs every 4 (four) hours as needed for wheezing or shortness of breath. 02/09/20   Katy Apo, NP  predniSONE (DELTASONE) 20 MG tablet Take 2 tablets (40 mg total) by mouth daily for 5 days. 02/09/20 02/14/20  Katy Apo, NP  medroxyPROGESTERone (PROVERA) 10 MG tablet Take 2 tablets (20mg ) po tid x 7 days, then 2 tablets (20mg ) po once daily maintenance after the initial 7 days 01/30/20 02/09/20  [name], MD    Family History Family History  Problem Relation Age of Onset   Mental illness Mother    Diabetes Mother    Thyroid disease Mother    Heart disease Maternal Grandmother    Heart disease Maternal Grandfather    Cancer Maternal Grandfather        lung    Social History Social History   Tobacco Use   Smoking status: Former Smoker    Types: Cigarettes    Quit date:  06/23/2014    Years since quitting: 5.6   Smokeless tobacco: Never Used  Vaping Use   Vaping Use: Never used  Substance Use Topics   Alcohol use: Yes    Comment: rare occasion   Drug use: No     Allergies   Patient has no known allergies.   Review of Systems Review of Systems  Constitutional: Positive for activity change, appetite change, chills, fatigue and fever. Negative for diaphoresis.  HENT: Positive for congestion, ear pain, postnasal drip, rhinorrhea, sinus pressure, sinus pain and sore throat. Negative for ear discharge, mouth sores, nosebleeds and trouble swallowing.   Eyes: Negative for pain, discharge, redness and itching.  Respiratory: Positive for cough, chest tightness, shortness of breath and wheezing.   Gastrointestinal: Positive for diarrhea and nausea. Negative for vomiting.  Musculoskeletal: Positive for arthralgias and myalgias. Negative for neck pain and neck stiffness.   Skin: Negative for color change, rash and wound.  Allergic/Immunologic: Negative for environmental allergies, food allergies and immunocompromised state.  Neurological: Positive for headaches. Negative for dizziness, seizures, syncope, weakness and light-headedness.  Hematological: Negative for adenopathy. Does not bruise/bleed easily.     Physical Exam Triage Vital Signs ED Triage Vitals  Enc Vitals Group     BP 02/09/20 0937 (!) 141/78     Pulse Rate 02/09/20 0937 95     Resp 02/09/20 0937 18     Temp 02/09/20 0937 99 F (37.2 C)     Temp Source 02/09/20 0937 Oral     SpO2 02/09/20 0937 100 %     Weight 02/09/20 0941 244 lb 12.8 oz (111 kg)     Height 02/09/20 0941 5\' 8"  (1.727 m)     Head Circumference --      Peak Flow --      Pain Score 02/09/20 0941 7     Pain Loc --      Pain Edu? --      Excl. in [place]? --    No data found.  Updated Vital Signs BP (!) 141/78 (BP Location: Right Arm)    Pulse 95    Temp 99 F (37.2 C) (Oral)    Resp 18    Ht 5\' 8"  (1.727 m)    Wt 244 lb 12.8 oz (111 kg)    SpO2 100%    BMI 37.22 kg/m   Visual Acuity Right Eye Distance:   Left Eye Distance:   Bilateral Distance:    Right Eye Near:   Left Eye Near:    Bilateral Near:     Physical Exam Vitals and nursing note reviewed.  Constitutional:      General: She is awake. She is not in acute distress.    Appearance: She is well-developed and well-groomed. She is ill-appearing.     Comments: She is sitting on the exam table in no acute distress but appears ill and tired. She is wearing a heavy jacket due to chills.   HENT:     Head: Normocephalic and atraumatic.     Right Ear: Hearing, ear canal and external ear normal. Tympanic membrane is bulging. Tympanic membrane is not injected or erythematous.     Left Ear: Hearing, ear canal and external ear normal. Tympanic membrane is bulging. Tympanic membrane is not injected or erythematous.     Nose: Mucosal edema and congestion present.      Right Sinus: Maxillary sinus tenderness and frontal sinus tenderness present.     Left Sinus: Maxillary sinus tenderness and  frontal sinus tenderness present.     Mouth/Throat:     Lips: Pink.     Mouth: Mucous membranes are moist.     Pharynx: Uvula midline. Posterior oropharyngeal erythema present. No pharyngeal swelling, oropharyngeal exudate or uvula swelling.  Eyes:     Extraocular Movements: Extraocular movements intact.     Conjunctiva/sclera: Conjunctivae normal.  Cardiovascular:     Rate and Rhythm: Regular rhythm. Tachycardia present.     Heart sounds: Normal heart sounds. No murmur heard.      Comments: Heart rate around 100.  Pulmonary:     Effort: Pulmonary effort is normal. No accessory muscle usage or respiratory distress.     Breath sounds: Decreased air movement present. Examination of the right-upper field reveals decreased breath sounds and wheezing. Examination of the left-upper field reveals decreased breath sounds and wheezing. Examination of the right-middle field reveals decreased breath sounds. Examination of the right-lower field reveals decreased breath sounds. Examination of the left-lower field reveals decreased breath sounds. Decreased breath sounds and wheezing present. No rhonchi or rales.  Musculoskeletal:        General: Normal range of motion.     Cervical back: Normal range of motion and neck supple. No rigidity.  Lymphadenopathy:     Cervical: No cervical adenopathy.  Skin:    General: Skin is warm.     Capillary Refill: Capillary refill takes less than 2 seconds.     Comments: Feels warm but no sweating   Neurological:     General: No focal deficit present.     Mental Status: She is alert and oriented to person, place, and time.  Psychiatric:        Mood and Affect: Mood normal.        Behavior: Behavior normal. Behavior is cooperative.        Thought Content: Thought content normal.        Judgment: Judgment normal.      UC Treatments /  Results  Labs (all labs ordered are listed, but only abnormal results are displayed) Labs Reviewed  SARS CORONAVIRUS 2 (TAT 6-24 HRS)    EKG   Radiology No results found.  Procedures Procedures (including critical care time)  Medications Ordered in UC Medications - No data to display  Initial Impression / Assessment and Plan / UC Course  I have reviewed the triage vital signs and the nursing notes.  Pertinent labs & imaging results that were available during my care of the patient were reviewed by me and considered in my medical decision making (see chart for details).    Reviewed with patient that she probably has a viral illness- strong suspicion for COVID 19.  Due to wheezing and tightness in chest, recommend start Prednisone 40mg  daily for 5 days. Use new Albuterol inhaler 2 puffs every 4 to 6 hours as needed. Continue to push fluids to help loosen up mucus in sinuses and chest. May take OTC Aleve 2 tablets every 12 hours as needed for body aches- may also use Tylenol 1000mg  every 8 hours as needed for fever. Rest. Note written for work. Follow-up pending COVID 19 test results.  Final Clinical Impressions(s) / UC Diagnoses   Final diagnoses:  Cough  Generalized body aches  Nasal congestion  Low grade fever     Discharge Instructions     Recommend start Prednisone 40mg  daily for 5 days then stop. May use Albuterol inhaler 2 puffs every 4 to 6 hours as needed for wheezing and  shortness of breath. Continue to increase fluids to help loosen up mucus in sinuses and chest. May take OTC Aleve 2 tablets every 12 hours as needed for body aches- may also use Tylenol 1000mg  every 8 hours as needed for fever. Rest. Follow-up pending COVID 19 test results.     ED Prescriptions    Medication Sig Dispense Auth. Provider   predniSONE (DELTASONE) 20 MG tablet Take 2 tablets (40 mg total) by mouth daily for 5 days. 10 tablet Katy Apo, NP   albuterol (VENTOLIN HFA) 108 (90 Base)  MCG/ACT inhaler Inhale 2 puffs into the lungs every 4 (four) hours as needed for wheezing or shortness of breath. 18 g Katy Apo, NP     PDMP not reviewed this encounter.   Katy Apo, NP 02/09/20 1815

## 2020-02-09 NOTE — ED Triage Notes (Signed)
Patient in today w/ c/o SOB, cough, wheezing, sore throat, N/V, loss of taste and smell, sinus congestion and drainage. Sx onset approx. 2 days ago. Patient states she has had COVID exposure and is not vaccinated.

## 2020-02-16 ENCOUNTER — Other Ambulatory Visit: Payer: Self-pay

## 2020-02-16 ENCOUNTER — Telehealth: Payer: Self-pay | Admitting: Family Medicine

## 2020-02-16 ENCOUNTER — Telehealth (INDEPENDENT_AMBULATORY_CARE_PROVIDER_SITE_OTHER): Payer: 59 | Admitting: Family Medicine

## 2020-02-16 ENCOUNTER — Telehealth: Payer: 59 | Admitting: Family Medicine

## 2020-02-16 ENCOUNTER — Encounter: Payer: Self-pay | Admitting: Family Medicine

## 2020-02-16 VITALS — Wt 240.0 lb

## 2020-02-16 DIAGNOSIS — F329 Major depressive disorder, single episode, unspecified: Secondary | ICD-10-CM | POA: Diagnosis not present

## 2020-02-16 DIAGNOSIS — F32A Depression, unspecified: Secondary | ICD-10-CM

## 2020-02-16 DIAGNOSIS — F419 Anxiety disorder, unspecified: Secondary | ICD-10-CM

## 2020-02-16 DIAGNOSIS — E039 Hypothyroidism, unspecified: Secondary | ICD-10-CM

## 2020-02-16 MED ORDER — SERTRALINE HCL 50 MG PO TABS: 50.0000 mg | ORAL_TABLET | Freq: Every day | ORAL | 0 refills | Status: DC

## 2020-02-16 MED ORDER — ALPRAZOLAM 0.25 MG PO TABS: 0.2500 mg | ORAL_TABLET | Freq: Every day | ORAL | 0 refills | Status: DC | PRN

## 2020-02-16 NOTE — Telephone Encounter (Signed)
Called and spoke with patient. She states has questions about her levothyroxine medication. Patient states that she has not been taking her thyroid med as prescribed. States she takes it when she remembers, once a week. Patient would like to check her TSH before starting taking the sertraline that was prescribed today.   Spoke with Apolonio Schneiders about the previous note. Apolonio Schneiders gave verbal order to order a TSH blood work and let patient know to come for lab work only and to hold on to sertraline if patient would like to. If patient decides to start taking the sertraline, its fine, per Apolonio Schneiders. Patient notified and verbalized understanding. Scheduled for labs only for tomorrow 01/30/20 at 9 am.

## 2020-02-16 NOTE — Telephone Encounter (Signed)
Copied from [place] [id number]. Topic: General - Call Back - No Documentation >> Feb 16, 2020  4:23 PM Erick Blinks wrote: Pt has a question regarding both of the medications she was just prescribed, requesting a call back from PCP/Nurse  Best contact: [phone]

## 2020-02-16 NOTE — Progress Notes (Signed)
Wt 240 lb (108.9 kg)   BMI 36.49 kg/m    Subjective:    Patient ID: Donna Cox, female    DOB: Mar 31, 1991, 29 y.o.   MRN: [id number]  HPI: Donna Cox is a 29 y.o. female  Chief Complaint  Patient presents with  . Anxiety  . Other    pt states she needs a wellness check for insurance     . This visit was completed via MyChart due to the restrictions of the COVID-19 pandemic. All issues as above were discussed and addressed. Physical exam was done as above through visual confirmation on MyChart. If it was felt that the patient should be evaluated in the office, they were directed there. The patient verbally consented to this visit. . Location of the patient: home . Location of the provider: work . Those involved with this call:  . Provider: Merrie Roof, PA-C . CMA: Lesle Chris, [place] . Front Desk/Registration: Jill Side  . Time spent on call: 30 minutes with patient face to face via video conference. More than 50% of this time was spent in counseling and coordination of care. 10 minutes total spent in review of patient's record and preparation of their chart. I verified patient identity using two factors (patient name and date of birth). Patient consents verbally to being seen via telemedicine visit today.   Started dealing with worsening depression when finding out she couldn't have any more kids over a year ago. Not sleeping, no libido, crying often, irritable with her children. Hx of post-partum depression with her daughter 8 years ago. Also was diagnosed with bipolar depression around then and tried latuda which didn't help. Did counseling at that time which helped. Denies SI/HI.   Depression screen [facility] 2/9 02/16/2020 01/26/2017  Decreased Interest 2 0  Down, Depressed, Hopeless 0 0  PHQ - 2 Score 2 0  Altered sleeping 2 -  Tired, decreased energy 3 -  Change in appetite 3 -  Feeling bad or failure about yourself  1 -  Trouble concentrating 3 -  Moving slowly or  fidgety/restless 2 -  Suicidal thoughts 0 -  PHQ-9 Score 16 -   GAD 7 : Generalized Anxiety Score 02/16/2020  Nervous, Anxious, on Edge 3  Control/stop worrying 3  Worry too much - different things 3  Trouble relaxing 3  Restless 1  Easily annoyed or irritable 3  Afraid - awful might happen 0  Total GAD 7 Score 16  Anxiety Difficulty Very difficult   Relevant past medical, surgical, family and social history reviewed and updated as indicated. Interim medical history since our last visit reviewed. Allergies and medications reviewed and updated.  Review of Systems  Per HPI unless specifically indicated above     Objective:    Wt 240 lb (108.9 kg)   BMI 36.49 kg/m   Wt Readings from Last 3 Encounters:  02/16/20 240 lb (108.9 kg)  02/09/20 244 lb 12.8 oz (111 kg)  01/31/19 234 lb 2 oz (106.2 kg)    Physical Exam Vitals and nursing note reviewed.  Constitutional:      General: She is not in acute distress.    Appearance: Normal appearance.  HENT:     Head: Atraumatic.     Right Ear: External ear normal.     Left Ear: External ear normal.     Nose: Nose normal. No congestion.     Mouth/Throat:     Mouth: Mucous membranes are moist.     Pharynx:  Oropharynx is clear. No posterior oropharyngeal erythema.  Eyes:     Extraocular Movements: Extraocular movements intact.     Conjunctiva/sclera: Conjunctivae normal.  Cardiovascular:     Comments: Unable to assess via virtual visit Pulmonary:     Effort: Pulmonary effort is normal. No respiratory distress.  Musculoskeletal:        General: Normal range of motion.     Cervical back: Normal range of motion.  Skin:    General: Skin is dry.     Findings: No erythema.  Neurological:     Mental Status: She is alert and oriented to person, place, and time.  Psychiatric:        Thought Content: Thought content normal.        Judgment: Judgment normal.     Comments: tearful    Results for orders placed or performed during  the hospital encounter of 02/09/20  SARS CORONAVIRUS 2 (TAT 6-24 HRS) Nasopharyngeal Nasopharyngeal Swab   Specimen: Nasopharyngeal Swab  Result Value Ref Range   SARS Coronavirus 2 POSITIVE (A) NEGATIVE      Assessment & Plan:   Problem List Items Addressed This Visit      Other   Anxiety and depression - Primary    Start zoloft, rare prn xanax for severe exacerbations, work on getting in with counseling. F/u 1 month for recheck      Relevant Medications   sertraline (ZOLOFT) 50 MG tablet   ALPRAZolam (XANAX) 0.25 MG tablet   Other Relevant Orders   Ambulatory referral to Psychology       Follow up plan: Return in about 4 weeks (around 03/15/2020) for anxiety and depression.

## 2020-02-16 NOTE — Assessment & Plan Note (Signed)
Start zoloft, rare prn xanax for severe exacerbations, work on getting in with counseling. F/u 1 month for recheck

## 2020-02-17 ENCOUNTER — Other Ambulatory Visit: Payer: Self-pay

## 2020-02-17 ENCOUNTER — Other Ambulatory Visit: Payer: 59

## 2020-02-17 DIAGNOSIS — E039 Hypothyroidism, unspecified: Secondary | ICD-10-CM

## 2020-02-18 LAB — TSH: TSH: 5.01 u[IU]/mL — ABNORMAL HIGH (ref 0.450–4.500)

## 2020-02-20 ENCOUNTER — Telehealth: Payer: Self-pay | Admitting: Family Medicine

## 2020-02-20 NOTE — Telephone Encounter (Signed)
Jolene, do you mind looking into this? See the telephone note from Friday. Looks like patient was supposed to be taking 75 mcg of Levothyroxine but has not been. TSH result was abnormal. No refills of med were sent in.

## 2020-02-20 NOTE — Telephone Encounter (Signed)
Pt called in to follow up on a Rx for her thyroid. Pt says that before her PCP left they was discussing her getting on a medication to help with her thyroid. Pt says that she would like to establish care with [facility] if possible. Pt would like further assistance.    Is a apt needed?   Please assist pt further.

## 2020-02-20 NOTE — Telephone Encounter (Signed)
Tried calling patient and left general HIPAA compliant message, sent her a MyChart message to get more information on what she is taking and my thoughts on changes.  Will send once I hear back from her.  I also alerted her that Janett Billow would be leaving in December and a new NP would be coming in to office around December to fill Rachel's shoes, but that any of [place] would be glad to see her in meantime.:)  Thanks

## 2020-02-24 ENCOUNTER — Ambulatory Visit: Payer: Self-pay | Admitting: Obstetrics and Gynecology

## 2020-03-02 ENCOUNTER — Ambulatory Visit (INDEPENDENT_AMBULATORY_CARE_PROVIDER_SITE_OTHER): Payer: 59

## 2020-03-02 ENCOUNTER — Ambulatory Visit (INDEPENDENT_AMBULATORY_CARE_PROVIDER_SITE_OTHER): Payer: 59 | Admitting: Obstetrics and Gynecology

## 2020-03-02 ENCOUNTER — Other Ambulatory Visit (HOSPITAL_COMMUNITY)
Admission: RE | Admit: 2020-03-02 | Discharge: 2020-03-02 | Disposition: A | Discharge status: Home / Self Care | Payer: 59 | Source: Ambulatory Visit | Attending: Obstetrics and Gynecology | Admitting: Obstetrics and Gynecology

## 2020-03-02 ENCOUNTER — Other Ambulatory Visit: Payer: Self-pay

## 2020-03-02 VITALS — BP 126/74 | Ht 68.0 in | Wt 240.0 lb

## 2020-03-02 DIAGNOSIS — N939 Abnormal uterine and vaginal bleeding, unspecified: Secondary | ICD-10-CM

## 2020-03-02 DIAGNOSIS — R9389 Abnormal findings on diagnostic imaging of other specified body structures: Secondary | ICD-10-CM

## 2020-03-02 DIAGNOSIS — Z6836 Body mass index (BMI) 36.0-36.9, adult: Secondary | ICD-10-CM

## 2020-03-02 DIAGNOSIS — E669 Obesity, unspecified: Secondary | ICD-10-CM | POA: Diagnosis not present

## 2020-03-02 DIAGNOSIS — Z124 Encounter for screening for malignant neoplasm of cervix: Secondary | ICD-10-CM | POA: Insufficient documentation

## 2020-03-02 NOTE — Progress Notes (Signed)
Gynecology Abnormal Uterine Bleeding Initial Evaluation   Chief Complaint:  Chief Complaint  Patient presents with  . Menstrual Problem    History of Present Illness:    Paitient is a 29 y.o. [id number] who LMP was No LMP recorded. (Menstrual status: Irregular Periods)., presents today for a problem visit.  She complains of menometrorrhagia that  began several weeks ago and its severity is described as severe.  The patient menstrual complaints are acute.  She has a known history of presumptive PCOS and anovulation.  Was evaluated by [facility].  Had moderate symptom COVID-19 in the past month.  She was started on provera taper prior to evaluation and noted some improvement in overall bleeding but not complete cessation. The patient is sexually active. She currently uses nothing for contraception.  Review of Systems: Review of Systems  Constitutional: Negative.   Gastrointestinal: Negative.   Genitourinary: Negative.   Endo/Heme/Allergies: Does not bruise/bleed easily.    Past Medical History:  Patient Active Problem List   Diagnosis Date Noted  . Anxiety and depression 02/16/2020  . Abnormal weight gain 10/10/2016  . Hypothyroid 10/10/2016  . History of vitamin D deficiency 10/10/2016  . Atypical nevi 01/23/2016    Past Surgical History:  No past surgical history on file.  Obstetric History: [id number]  Family History:  Family History  Problem Relation Age of Onset  . Mental illness Mother   . Diabetes Mother   . Thyroid disease Mother   . Heart disease Maternal Grandmother   . Heart disease Maternal Grandfather   . Cancer Maternal Grandfather        lung    Social History:  Social History   Socioeconomic History  . Marital status: Married    Spouse name: Not on file  . Number of children: Not on file  . Years of education: Not on file  . Highest education level: Not on file  Occupational History  . Not on file  Tobacco Use  . Smoking status: Former Smoker     Types: Cigarettes    Quit date: 06/23/2014    Years since quitting: 5.6  . Smokeless tobacco: Never Used  Vaping Use  . Vaping Use: Never used  Substance and Sexual Activity  . Alcohol use: Yes    Comment: rare occasion  . Drug use: No  . Sexual activity: Yes    Birth control/protection: None  Other Topics Concern  . Not on file  Social History Narrative  . Not on file   Social Determinants of Health   Financial Resource Strain:   . Difficulty of Paying Living Expenses: Not on file  Food Insecurity:   . Worried About Charity fundraiser in the Last Year: Not on file  . Ran Out of Food in the Last Year: Not on file  Transportation Needs:   . Lack of Transportation (Medical): Not on file  . Lack of Transportation (Non-Medical): Not on file  Physical Activity:   . Days of Exercise per Week: Not on file  . Minutes of Exercise per Session: Not on file  Stress:   . Feeling of Stress : Not on file  Social Connections:   . Frequency of Communication with Friends and Family: Not on file  . Frequency of Social Gatherings with Friends and Family: Not on file  . Attends Religious Services: Not on file  . Active Member of Clubs or Organizations: Not on file  . Attends Archivist Meetings: Not  on file  . Marital Status: Not on file  Intimate Partner Violence:   . Fear of Current or Ex-Partner: Not on file  . Emotionally Abused: Not on file  . Physically Abused: Not on file  . Sexually Abused: Not on file    Allergies:  No Known Allergies  Medications: Prior to Admission medications   Medication Sig Start Date End Date Taking? Authorizing Provider  albuterol (VENTOLIN HFA) 108 (90 Base) MCG/ACT inhaler Inhale 2 puffs into the lungs every 4 (four) hours as needed for wheezing or shortness of breath. 02/09/20  Yes Amyot, Nicholes Stairs, NP  Prenatal Vit-Fe Fumarate-FA (PRENATAL VITAMIN PO) Take 1 Dose by mouth daily.   Yes [provider]  ALPRAZolam (XANAX) 0.25 MG  tablet Take 1 tablet (0.25 mg total) by mouth daily as needed for anxiety. Patient not taking: Reported on 03/02/2020 02/16/20   Volney American, PA-C  levothyroxine (SYNTHROID) 75 MCG tablet Take 75 mcg by mouth daily. Patient not taking: Reported on 03/02/2020 01/09/17   [provider]  sertraline (ZOLOFT) 50 MG tablet Take 1 tablet (50 mg total) by mouth daily. Patient not taking: Reported on 03/02/2020 02/16/20   Volney American, PA-C  medroxyPROGESTERone (PROVERA) 10 MG tablet Take 2 tablets (20mg ) po tid x 7 days, then 2 tablets (20mg ) po once daily maintenance after the initial 7 days 01/30/20 02/09/20  [name], MD    Physical Exam Blood pressure 126/74, height 5\' 8"  (1.727 m), weight 240 lb (108.9 kg), unknown if currently breastfeeding.  No LMP recorded. (Menstrual status: Irregular Periods).  General: NAD HEENT: normocephalic, anicteric Pulmonary: No increased work of breathing Abdomen: soft, non-tender, non-distended.  Umbilicus without lesions.  No hepatomegaly, splenomegaly or masses palpable. No evidence of hernia  Genitourinary:  External: Normal external female genitalia.  Normal urethral meatus, normal Bartholin's and Skene's glands.    Vagina: Normal vaginal mucosa, no evidence of prolapse.    Cervix: Grossly normal in appearance, minimal bleeding  Uterus: Non-enlarged, mobile, normal contour.  No CMT  Adnexa: ovaries non-enlarged, no adnexal masses  Rectal: deferred  Lymphatic: no evidence of inguinal lymphadenopathy Extremities: no edema, erythema, or tenderness Neurologic: Grossly intact Psychiatric: mood appropriate, affect full  Female chaperone present for pelvic portions of the physical exam  US Transvaginal Non-OB  Result Date: 03/02/2020 Patient Name: Ilynn Stauffer DOB: 01-19-1991 MRN: [id number] ULTRASOUND REPORT Location: [facility] Date of Service: 03/02/2020 Indications:Abnormal Uterine Bleeding Findings: The uterus is  anteverted and measures 8.7 x 5.4 x 4.2 cm. Echo texture is homogenous without evidence of focal masses. The Endometrium measures 16.2 mm. Right Ovary measures 4.1 x 3.0 x 2.2 cm. It is normal in appearance. Left Ovary measures 3.5 x 2.5 x 2.6 cm. It is normal in appearance. Survey of the adnexa demonstrates no adnexal masses. There is no free fluid in the cul de sac. Impression: 1. The endometrial lining appears thick. An endometrial polyp can not be ruled out. 2. Normal appearing ovaries. Recommendations: 1.Clinical correlation with the patient's History and Physical Exam. Gweneth Dimitri, RT Images reviewed.  Normal GYN study without visualized pathology.  The endometrial lining is thickened without evidence of focal abnormalities such as polyp although this remains in the differential.  [name], MD, [place] 03/02/2020, 2:25 PM     Assessment: 29 y.o. [id number] with abnormal uterine bleeding  Plan: Problem List Items Addressed This Visit    None    Visit Diagnoses  Abnormal uterine bleeding    -  Primary   Relevant Orders   TSH+Prl+FSH+TestT+LH+DHEA S...   T4, free   Hemoglobin A1c   US Transvaginal Non-OB   CBC   Screening for malignant neoplasm of cervix       Relevant Orders   Cytology - PAP   Class 2 obesity without serious comorbidity with body mass index (BMI) of 36.0 to 36.9 in adult, unspecified obesity type       Relevant Orders   TSH+Prl+FSH+TestT+LH+DHEA S...   T4, free   Hemoglobin A1c      1) Discussed management options for abnormal uterine bleeding including expectant, NSAIDs, tranexamic acid (Lysteda), oral progesterone (Provera, norethindrone, megace), Depo Provera, Levonorgestrel containing IUD, endometrial ablation (Novasure) or hysterectomy as definitive surgical management.  Discussed risks and benefits of each method.   Final management decision will hinge on results of patient's work up and whether an underlying  etiology for the patients bleeding symptoms can be discerned.  We will conduct a basic work up examining using the PALM-COIEN classification system.  In the meantime the patient opts to continue trial provera while we await results of her ultrasound and labs.  The role of unopposed estrogen in the development of endometrial hyperplasia or carcinoma is discussed.  The risk of endometrial hyperplasia is linearly correlated with increasing BMI given the production of estrone by adipose tissue. Printed patient education handouts were given to the patient to review at home.  Bleeding precautions reviewed.  - Post for hysteroscopy D&C  2) A total of 15 minutes were spent in face-to-face contact with the patient during this encounter with over half of that time devoted to counseling and coordination of care.  3)  Return in about 1 week (around 03/09/2020) for gyn TVUS and follow up.   [name], MD, [facility], [facility] 03/02/2020, 8:42 AM

## 2020-03-06 LAB — CYTOLOGY - PAP
Adequacy: ABSENT
Diagnosis: NEGATIVE

## 2020-03-07 ENCOUNTER — Telehealth: Payer: Self-pay | Admitting: Obstetrics and Gynecology

## 2020-03-07 NOTE — Telephone Encounter (Signed)
-----   Message from [name], MD sent at 03/02/2020  7:00 PM EDT ----- Regarding: Surgery Surgery Booking Request Patient Full Name:  Donna Cox  MRN: [id number]  DOB: [birth year]  Surgeon: [name], MD  Requested Surgery Date and Time: Next 1-4 weeks Primary Diagnosis AND Code: BKO N93.9 Secondary Diagnosis and Code: Thickened endometrium R93.89 Surgical Procedure: Hysteroscopy D&C RNFA Requested?: No L&D Notification: No Admission Status: same day surgery Length of Surgery: 50 min Special Case Needs: No H&P: Yes Phone Interview???:  Yes Interpreter: No Medical Clearance:  No Special Scheduling Instructions: No Any known health/anesthesia issues, diabetes, sleep apnea, latex allergy, defibrillator/pacemaker?: No Acuity: P2   (P1 highest, P2 delay may cause harm, P3 low, elective gyn, P4 lowest)

## 2020-03-07 NOTE — Telephone Encounter (Signed)
Patient called to schedule Hysteroscopy D&C w Georgianne Fick  DOS 9/23   *She asked if he had anything sooner because she is bleeding even heavier now. "When I got out of bed this morning it looked like a murder scene." I adv that this is his next day in OR but I would adv him of the increased bleeding. She also asked about her lab results from Friday as she hasn't seen them on her MyChart. I adv that I would also notify him of her question.  H&P 9/17 @ 4:30   Covid testing 9/21 @ 8-10:30, [facility] [employer], drive up and wear mask. Advised pt to quarantine until DOS.  Pre-admit phone call appointment to be requested - date and time will be included on H&P paper work. Also all appointments will be updated on pt MyChart. Explained that this appointment has a call window. Based on the time scheduled will indicate if the call will be received within a 4 hour window before 1:00 or after.  Advised that pt may also receive calls from the hospital pharmacy and pre-service center.  Confirmed pt has [employer] as primary insurance. No secondary insurance.

## 2020-03-08 ENCOUNTER — Other Ambulatory Visit: Payer: Self-pay | Admitting: Obstetrics and Gynecology

## 2020-03-08 LAB — CBC
Hematocrit: 36.9 % (ref 34.0–46.6)
Hemoglobin: 12.3 g/dL (ref 11.1–15.9)
MCH: 28.7 pg (ref 26.6–33.0)
MCHC: 33.3 g/dL (ref 31.5–35.7)
MCV: 86 fL (ref 79–97)
Platelets: 249 10*3/uL (ref 150–450)
RBC: 4.29 x10E6/uL (ref 3.77–5.28)
RDW: 13.5 % (ref 11.7–15.4)
WBC: 4.9 10*3/uL (ref 3.4–10.8)

## 2020-03-08 LAB — TSH+PRL+FSH+TESTT+LH+DHEA S...
17-Hydroxyprogesterone: 43 ng/dL
Androstenedione: 234 ng/dL (ref 41–262)
DHEA-SO4: 38.9 ug/dL — ABNORMAL LOW (ref 84.8–378.0)
FSH: 4.7 m[IU]/mL
LH: 10.9 m[IU]/mL
Prolactin: 12.6 ng/mL (ref 4.8–23.3)
TSH: 6.06 u[IU]/mL — ABNORMAL HIGH (ref 0.450–4.500)
Testosterone, Free: 2.6 pg/mL (ref 0.0–4.2)
Testosterone: 53 ng/dL (ref 13–71)

## 2020-03-08 LAB — HEMOGLOBIN A1C
Est. average glucose Bld gHb Est-mCnc: 117 mg/dL
Hgb A1c MFr Bld: 5.7 % — ABNORMAL HIGH (ref 4.8–5.6)

## 2020-03-08 LAB — T4, FREE: Free T4: 0.81 ng/dL — ABNORMAL LOW (ref 0.82–1.77)

## 2020-03-08 MED ORDER — LEVOTHYROXINE SODIUM 50 MCG PO TABS: 50.0000 ug | ORAL_TABLET | Freq: Every day | ORAL | 6 refills | Status: DC

## 2020-03-08 NOTE — Telephone Encounter (Signed)
Her CBC has been stable I think that is probably still going to be the earliest date

## 2020-03-08 NOTE — Progress Notes (Signed)
Start synthroid 70mcg

## 2020-03-09 ENCOUNTER — Ambulatory Visit (INDEPENDENT_AMBULATORY_CARE_PROVIDER_SITE_OTHER): Payer: 59 | Admitting: Obstetrics and Gynecology

## 2020-03-09 ENCOUNTER — Encounter: Payer: Self-pay | Admitting: Obstetrics and Gynecology

## 2020-03-09 ENCOUNTER — Other Ambulatory Visit: Payer: Self-pay

## 2020-03-09 VITALS — BP 134/74 | HR 76 | Ht 68.0 in | Wt 242.0 lb

## 2020-03-09 DIAGNOSIS — Z01818 Encounter for other preprocedural examination: Secondary | ICD-10-CM

## 2020-03-09 DIAGNOSIS — N939 Abnormal uterine and vaginal bleeding, unspecified: Secondary | ICD-10-CM

## 2020-03-09 NOTE — Progress Notes (Signed)
Obstetrics & Gynecology Surgery H&P    Chief Complaint: Scheduled Surgery   History of Present Illness: Patient is a 29 y.o. [id number] presenting for scheduled hysteroscopy D&C, for the treatment or further evaluation of AUB, thickened endometrium.   Prior Treatments prior to proceeding with surgery include: po progestin therapy  Preoperative Pap: 03/02/2020 Results: no abnormalities  Preoperative Endometrial biopsy: N/A Preoperative Ultrasound: 9/10/2021Findings: Normal other than thickened endometrium at 16.71mm, no focal abnormality noted but polyp was not excluded  Review of Systems:10 point review of systems  Past Medical History:  Patient Active Problem List   Diagnosis Date Noted  . Anxiety and depression 02/16/2020  . Abnormal weight gain 10/10/2016  . Hypothyroid 10/10/2016  . History of vitamin D deficiency 10/10/2016  . Atypical nevi 01/23/2016    Past Surgical History:  No past surgical history on file.  Family History:  Family History  Problem Relation Age of Onset  . Mental illness Mother   . Diabetes Mother   . Thyroid disease Mother   . Heart disease Maternal Grandmother   . Heart disease Maternal Grandfather   . Cancer Maternal Grandfather        lung    Social History:  Social History   Socioeconomic History  . Marital status: Married    Spouse name: Not on file  . Number of children: Not on file  . Years of education: Not on file  . Highest education level: Not on file  Occupational History  . Not on file  Tobacco Use  . Smoking status: Former Smoker    Types: Cigarettes    Quit date: 06/23/2014    Years since quitting: 5.7  . Smokeless tobacco: Never Used  Vaping Use  . Vaping Use: Never used  Substance and Sexual Activity  . Alcohol use: Yes    Comment: rare occasion  . Drug use: No  . Sexual activity: Yes    Birth control/protection: None  Other Topics Concern  . Not on file  Social History Narrative  . Not on file   Social  Determinants of Health   Financial Resource Strain:   . Difficulty of Paying Living Expenses: Not on file  Food Insecurity:   . Worried About Charity fundraiser in the Last Year: Not on file  . Ran Out of Food in the Last Year: Not on file  Transportation Needs:   . Lack of Transportation (Medical): Not on file  . Lack of Transportation (Non-Medical): Not on file  Physical Activity:   . Days of Exercise per Week: Not on file  . Minutes of Exercise per Session: Not on file  Stress:   . Feeling of Stress : Not on file  Social Connections:   . Frequency of Communication with Friends and Family: Not on file  . Frequency of Social Gatherings with Friends and Family: Not on file  . Attends Religious Services: Not on file  . Active Member of Clubs or Organizations: Not on file  . Attends Archivist Meetings: Not on file  . Marital Status: Not on file  Intimate Partner Violence:   . Fear of Current or Ex-Partner: Not on file  . Emotionally Abused: Not on file  . Physically Abused: Not on file  . Sexually Abused: Not on file    Allergies:  No Known Allergies  Medications: Prior to Admission medications   Medication Sig Start Date End Date Taking? Authorizing Provider  albuterol (VENTOLIN HFA) 108 (90 Base) MCG/ACT  inhaler Inhale 2 puffs into the lungs every 4 (four) hours as needed for wheezing or shortness of breath. Patient not taking: Reported on 03/07/2020 02/09/20   Katy Apo, NP  ALPRAZolam Duanne Moron) 0.25 MG tablet Take 1 tablet (0.25 mg total) by mouth daily as needed for anxiety. Patient not taking: Reported on 03/02/2020 02/16/20   Volney American, PA-C  aspirin-acetaminophen-caffeine [facility] MIGRAINE) [phone] MG tablet Take 2 tablets by mouth every 6 (six) hours as needed for headache.    [provider]  levothyroxine (SYNTHROID) 50 MCG tablet Take 1 tablet (50 mcg total) by mouth daily before breakfast. 03/08/20   [name], MD   medroxyPROGESTERone (PROVERA) 10 MG tablet Take 10 mg by mouth daily.    [provider]  Prenatal Vit-Fe Fumarate-FA (PRENATAL VITAMIN PO) Take 1 tablet by mouth daily.     [provider]  sertraline (ZOLOFT) 50 MG tablet Take 1 tablet (50 mg total) by mouth daily. Patient not taking: Reported on 03/02/2020 02/16/20   Volney American, PA-C    Physical Exam Vitals: Blood pressure 134/74, pulse 76, height 5\' 8"  (1.727 m), weight 242 lb (109.8 kg), unknown if currently breastfeeding.   General: NAD HEENT: normocephalic, anicteric Pulmonary: No increased work of breathing, CTAB Cardiovascular: RRR, distal pulses 2+ Abdomen: NABS, soft, non-tender, non-distended Extremities: no edema, erythema, or tenderness Neurologic: Grossly intact Psychiatric: mood appropriate, affect full  Imaging US Transvaginal Non-OB  Result Date: 03/02/2020 Patient Name: Khadijah Mastrianni DOB: 07/23/1990 MRN: [id number] ULTRASOUND REPORT Location: [facility] Date of Service: 03/02/2020 Indications:Abnormal Uterine Bleeding Findings: The uterus is anteverted and measures 8.7 x 5.4 x 4.2 cm. Echo texture is homogenous without evidence of focal masses. The Endometrium measures 16.2 mm. Right Ovary measures 4.1 x 3.0 x 2.2 cm. It is normal in appearance. Left Ovary measures 3.5 x 2.5 x 2.6 cm. It is normal in appearance. Survey of the adnexa demonstrates no adnexal masses. There is no free fluid in the cul de sac. Impression: 1. The endometrial lining appears thick. An endometrial polyp can not be ruled out. 2. Normal appearing ovaries. Recommendations: 1.Clinical correlation with the patient's History and Physical Exam. Gweneth Dimitri, RT Images reviewed.  Normal GYN study without visualized pathology.  The endometrial lining is thickened without evidence of focal abnormalities such as polyp although this remains in the differential.  [name], MD, [place] 03/02/2020, 2:25 PM    Assessment: 29 y.o. [id number] presenting for scheduled hysteroscopy, D&C  Plan: 1) I have discussed with the patient the indications for the procedure. Included in the discussion were the options of therapy, as wall as their individual risks, benefits, and complications. Ample time was given to answer all questions.   In office pipelle biopsy generally provides comparable results to [facility], however this sampling modality may miss focal lesions if these were previously documented on ultrasound.  It is because of the potential to miss focal lesions that hysteroscopy D&C is also warranted in patient with continued postmenopausal bleeding that is not self limited regardless of prior in office biopsy results or ultrasound findings.  She understands that the risk of continued observation include worsening bleeding or worsening of any underlying pathology.  The choices include: 1. Doing nothing but following her symptoms 2. Attempts at hormonal manipulation with either BCP or Depo-Provera for premenopausal patients with no concern for focal lesion or endometrial pathology 3. D&C/hysteroscopy. 4. Endometrial ablation via Novasure or other techniques  for premenopausal patients with no concern for focal lesion or endometrial pathology  5. As final resort, hysterectomy. After consideration of her history and findings, mutual decision has been made to proceed with D+C/hysteroscopy. While the incidence is low, the risks from this surgery include, but are not limited to, the risks of anesthesia, hemorrhage, infection, perforation, and injury to adjacent structures including bowel, bladder and blood vessels.    2) Routine postoperative instructions were reviewed with the patient and her family in detail today including the expected length of recovery and likely postoperative course.  The patient concurred with the proposed plan, giving informed written consent for the surgery today.  Patient  instructed on the importance of being NPO after midnight prior to her procedure.  If warranted preoperative prophylactic antibiotics and SCDs ordered on call to the OR to meet SCIP guidelines and adhere to recommendation laid forth in [place] Number 104 May 2009  "Antibiotic Prophylaxis for Gynecologic Procedures".     [name], MD, [facility], [facility] 03/09/2020, 4:15 PM

## 2020-03-09 NOTE — H&P (View-Only) (Signed)
Obstetrics & Gynecology Surgery H&P    Chief Complaint: Scheduled Surgery   History of Present Illness: Patient is a 29 y.o. [id number] presenting for scheduled hysteroscopy D&C, for the treatment or further evaluation of AUB, thickened endometrium.   Prior Treatments prior to proceeding with surgery include: po progestin therapy  Preoperative Pap: 03/02/2020 Results: no abnormalities  Preoperative Endometrial biopsy: N/A Preoperative Ultrasound: 9/10/2021Findings: Normal other than thickened endometrium at 16.77mm, no focal abnormality noted but polyp was not excluded  Review of Systems:10 point review of systems  Past Medical History:  Patient Active Problem List   Diagnosis Date Noted  . Anxiety and depression 02/16/2020  . Abnormal weight gain 10/10/2016  . Hypothyroid 10/10/2016  . History of vitamin D deficiency 10/10/2016  . Atypical nevi 01/23/2016    Past Surgical History:  No past surgical history on file.  Family History:  Family History  Problem Relation Age of Onset  . Mental illness Mother   . Diabetes Mother   . Thyroid disease Mother   . Heart disease Maternal Grandmother   . Heart disease Maternal Grandfather   . Cancer Maternal Grandfather        lung    Social History:  Social History   Socioeconomic History  . Marital status: Married    Spouse name: Not on file  . Number of children: Not on file  . Years of education: Not on file  . Highest education level: Not on file  Occupational History  . Not on file  Tobacco Use  . Smoking status: Former Smoker    Types: Cigarettes    Quit date: 06/23/2014    Years since quitting: 5.7  . Smokeless tobacco: Never Used  Vaping Use  . Vaping Use: Never used  Substance and Sexual Activity  . Alcohol use: Yes    Comment: rare occasion  . Drug use: No  . Sexual activity: Yes    Birth control/protection: None  Other Topics Concern  . Not on file  Social History Narrative  . Not on file   Social  Determinants of Health   Financial Resource Strain:   . Difficulty of Paying Living Expenses: Not on file  Food Insecurity:   . Worried About Charity fundraiser in the Last Year: Not on file  . Ran Out of Food in the Last Year: Not on file  Transportation Needs:   . Lack of Transportation (Medical): Not on file  . Lack of Transportation (Non-Medical): Not on file  Physical Activity:   . Days of Exercise per Week: Not on file  . Minutes of Exercise per Session: Not on file  Stress:   . Feeling of Stress : Not on file  Social Connections:   . Frequency of Communication with Friends and Family: Not on file  . Frequency of Social Gatherings with Friends and Family: Not on file  . Attends Religious Services: Not on file  . Active Member of Clubs or Organizations: Not on file  . Attends Archivist Meetings: Not on file  . Marital Status: Not on file  Intimate Partner Violence:   . Fear of Current or Ex-Partner: Not on file  . Emotionally Abused: Not on file  . Physically Abused: Not on file  . Sexually Abused: Not on file    Allergies:  No Known Allergies  Medications: Prior to Admission medications   Medication Sig Start Date End Date Taking? Authorizing Provider  albuterol (VENTOLIN HFA) 108 (90 Base) MCG/ACT  inhaler Inhale 2 puffs into the lungs every 4 (four) hours as needed for wheezing or shortness of breath. Patient not taking: Reported on 03/07/2020 02/09/20   Katy Apo, NP  ALPRAZolam Duanne Moron) 0.25 MG tablet Take 1 tablet (0.25 mg total) by mouth daily as needed for anxiety. Patient not taking: Reported on 03/02/2020 02/16/20   Volney American, PA-C  aspirin-acetaminophen-caffeine [facility] MIGRAINE) [phone] MG tablet Take 2 tablets by mouth every 6 (six) hours as needed for headache.    [provider]  levothyroxine (SYNTHROID) 50 MCG tablet Take 1 tablet (50 mcg total) by mouth daily before breakfast. 03/08/20   [name], MD    medroxyPROGESTERone (PROVERA) 10 MG tablet Take 10 mg by mouth daily.    [provider]  Prenatal Vit-Fe Fumarate-FA (PRENATAL VITAMIN PO) Take 1 tablet by mouth daily.     [provider]  sertraline (ZOLOFT) 50 MG tablet Take 1 tablet (50 mg total) by mouth daily. Patient not taking: Reported on 03/02/2020 02/16/20   Volney American, PA-C    Physical Exam Vitals: Blood pressure 134/74, pulse 76, height 5\' 8"  (1.727 m), weight 242 lb (109.8 kg), unknown if currently breastfeeding.   General: NAD HEENT: normocephalic, anicteric Pulmonary: No increased work of breathing, CTAB Cardiovascular: RRR, distal pulses 2+ Abdomen: NABS, soft, non-tender, non-distended Extremities: no edema, erythema, or tenderness Neurologic: Grossly intact Psychiatric: mood appropriate, affect full  Imaging US Transvaginal Non-OB  Result Date: 03/02/2020 Patient Name: Donna Cox DOB: April 27, 1991 MRN: [id number] ULTRASOUND REPORT Location: [facility] Date of Service: 03/02/2020 Indications:Abnormal Uterine Bleeding Findings: The uterus is anteverted and measures 8.7 x 5.4 x 4.2 cm. Echo texture is homogenous without evidence of focal masses. The Endometrium measures 16.2 mm. Right Ovary measures 4.1 x 3.0 x 2.2 cm. It is normal in appearance. Left Ovary measures 3.5 x 2.5 x 2.6 cm. It is normal in appearance. Survey of the adnexa demonstrates no adnexal masses. There is no free fluid in the cul de sac. Impression: 1. The endometrial lining appears thick. An endometrial polyp can not be ruled out. 2. Normal appearing ovaries. Recommendations: 1.Clinical correlation with the patient's History and Physical Exam. Gweneth Dimitri, RT Images reviewed.  Normal GYN study without visualized pathology.  The endometrial lining is thickened without evidence of focal abnormalities such as polyp although this remains in the differential.  [name], MD, [place] 03/02/2020, 2:25 PM    Assessment: 29 y.o. [id number] presenting for scheduled hysteroscopy, D&C  Plan: 1) I have discussed with the patient the indications for the procedure. Included in the discussion were the options of therapy, as wall as their individual risks, benefits, and complications. Ample time was given to answer all questions.   In office pipelle biopsy generally provides comparable results to [facility], however this sampling modality may miss focal lesions if these were previously documented on ultrasound.  It is because of the potential to miss focal lesions that hysteroscopy D&C is also warranted in patient with continued postmenopausal bleeding that is not self limited regardless of prior in office biopsy results or ultrasound findings.  She understands that the risk of continued observation include worsening bleeding or worsening of any underlying pathology.  The choices include: 1. Doing nothing but following her symptoms 2. Attempts at hormonal manipulation with either BCP or Depo-Provera for premenopausal patients with no concern for focal lesion or endometrial pathology 3. D&C/hysteroscopy. 4. Endometrial ablation via Novasure or other  techniques for premenopausal patients with no concern for focal lesion or endometrial pathology  5. As final resort, hysterectomy. After consideration of her history and findings, mutual decision has been made to proceed with D+C/hysteroscopy. While the incidence is low, the risks from this surgery include, but are not limited to, the risks of anesthesia, hemorrhage, infection, perforation, and injury to adjacent structures including bowel, bladder and blood vessels.    2) Routine postoperative instructions were reviewed with the patient and her family in detail today including the expected length of recovery and likely postoperative course.  The patient concurred with the proposed plan, giving informed written consent for the surgery today.  Patient  instructed on the importance of being NPO after midnight prior to her procedure.  If warranted preoperative prophylactic antibiotics and SCDs ordered on call to the OR to meet SCIP guidelines and adhere to recommendation laid forth in [place] Number 104 May 2009  "Antibiotic Prophylaxis for Gynecologic Procedures".     [name], MD, [facility], [facility] 03/09/2020, 4:15 PM

## 2020-03-12 ENCOUNTER — Other Ambulatory Visit: Payer: Self-pay

## 2020-03-12 ENCOUNTER — Encounter
Admission: RE | Admit: 2020-03-12 | Discharge: 2020-03-12 | Disposition: A | Discharge status: Home / Self Care | Payer: 59 | Source: Ambulatory Visit | Attending: Obstetrics and Gynecology | Admitting: Obstetrics and Gynecology

## 2020-03-12 HISTORY — DX: Gastro-esophageal reflux disease without esophagitis: K21.9

## 2020-03-12 HISTORY — DX: Hypothyroidism, unspecified: E03.9

## 2020-03-12 HISTORY — DX: Prediabetes: R73.03

## 2020-03-12 HISTORY — DX: Personal history of urinary calculi: Z87.442

## 2020-03-12 NOTE — Patient Instructions (Signed)
Your procedure is scheduled on: 03-15-20 THURSDAY Report to Same Day Surgery 2nd floor medical mall [facility] Entrance-take elevator on left to 2nd floor.  Check in with surgery information desk.) To find out your arrival time please call [phone] between 1PM - 3PM on 03-14-20 [facility]  Remember: Instructions that are not followed completely may result in serious medical risk, up to and including death, or upon the discretion of your surgeon and anesthesiologist your surgery may need to be rescheduled.    _x___ 1. Do not eat food after midnight the night before your procedure. NO GUM OR CANDY AFTER MIDNIGHT. You may drink clear liquids up to 2 hours before you are scheduled to arrive at the hospital for your procedure.  Do not drink clear liquids within 2 hours of your scheduled arrival to the hospital.  Clear liquids include  --Water or Apple juice without pulp  --Gatorade  --Black Coffee or Clear Tea (No milk, no creamers, do not add anything to the coffee or Tea-OK TO ADD SUGAR)  _X___Ensure clear carbohydrate drink-FINISH DRINK 2 HOURS PRIOR TO ARRIVAL TIME TO HOSPITAL THE DAY OF SURGERY   __x__ 2. No Alcohol for 24 hours before or after surgery.   __x__3. No Smoking or e-cigarettes for 24 prior to surgery.  Do not use any chewable tobacco products for at least 6 hour prior to surgery   ____  4. Bring all medications with you on the day of surgery if instructed.    __x__ 5. Notify your doctor if there is any change in your medical condition     (cold, fever, infections).    x___6. On the morning of surgery brush your teeth with toothpaste and water.  You may rinse your mouth with mouth wash if you wish.  Do not swallow any toothpaste or mouthwash.   Do not wear jewelry, make-up, hairpins, clips or nail polish.  Do not wear lotions, powders, or perfumes. You may wear deodorant.  Do not shave 48 hours prior to surgery. Men may shave face and neck.  Do not bring valuables to  the hospital.    [facility] is not responsible for any belongings or valuables.               Contacts, dentures or bridgework may not be worn into surgery.  Leave your suitcase in the car. After surgery it may be brought to your room.  For patients admitted to the hospital, discharge time is determined by your treatment team.  _  Patients discharged the day of surgery will not be allowed to drive home.  You will need someone to drive you home and stay with you the night of your procedure.    Please read over the following fact sheets that you were given:   [facility] Preparing for Surgery   _x___ TAKE THE FOLLOWING MEDICATION THE MORNING OF SURGERY WITH A SMALL SIP OF WATER. These include:  1. SYNTHROID (LEVOTHYROXINE)  2.  3.  4.  5.  6.  ____Fleets enema or Magnesium Citrate as directed.   ____ Use CHG Soap or sage wipes as directed on instruction sheet   ____ Use inhalers on the day of surgery and bring to hospital day of surgery  ____ Stop Metformin and Janumet 2 days prior to surgery.    ____ Take 1/2 of usual insulin dose the night before surgery and none on the morning surgery.   ____ Follow recommendations from Cardiologist, Pulmonologist or PCP regarding stopping  Aspirin, Coumadin, Plavix ,Eliquis, Effient, or Pradaxa, and Pletal.  X____Stop Anti-inflammatories such as Advil, Aleve, Ibuprofen, Motrin, Naproxen, Naprosyn, Goodies powders or aspirin products NOW-OK to take Tylenol    ____ Stop supplements until after surgery.     ____ Bring C-Pap to the hospital.

## 2020-03-13 ENCOUNTER — Other Ambulatory Visit
Admission: RE | Admit: 2020-03-13 | Discharge: 2020-03-13 | Disposition: A | Discharge status: Home / Self Care | Payer: 59 | Source: Ambulatory Visit | Attending: Obstetrics and Gynecology | Admitting: Obstetrics and Gynecology

## 2020-03-13 NOTE — Pre-Procedure Instructions (Signed)
Patient did not need covid testing prior to upcoming surgery as she was positive on 02/09/20.

## 2020-03-15 ENCOUNTER — Encounter
Admission: RE | Disposition: A | Discharge status: Home / Self Care | Payer: Self-pay | Source: Home / Self Care | Attending: Obstetrics and Gynecology

## 2020-03-15 ENCOUNTER — Other Ambulatory Visit: Payer: Self-pay

## 2020-03-15 ENCOUNTER — Ambulatory Visit: Payer: 59 | Admitting: Certified Registered"

## 2020-03-15 ENCOUNTER — Encounter: Payer: Self-pay | Admitting: Obstetrics and Gynecology

## 2020-03-15 ENCOUNTER — Ambulatory Visit
Admission: RE | Admit: 2020-03-15 | Discharge: 2020-03-15 | Disposition: A | Discharge status: Home / Self Care | Payer: 59 | Attending: Obstetrics and Gynecology | Admitting: Obstetrics and Gynecology

## 2020-03-15 DIAGNOSIS — Z818 Family history of other mental and behavioral disorders: Secondary | ICD-10-CM | POA: Diagnosis not present

## 2020-03-15 DIAGNOSIS — R7303 Prediabetes: Secondary | ICD-10-CM | POA: Diagnosis not present

## 2020-03-15 DIAGNOSIS — Z833 Family history of diabetes mellitus: Secondary | ICD-10-CM | POA: Insufficient documentation

## 2020-03-15 DIAGNOSIS — F419 Anxiety disorder, unspecified: Secondary | ICD-10-CM | POA: Diagnosis not present

## 2020-03-15 DIAGNOSIS — R9389 Abnormal findings on diagnostic imaging of other specified body structures: Secondary | ICD-10-CM

## 2020-03-15 DIAGNOSIS — Z87442 Personal history of urinary calculi: Secondary | ICD-10-CM | POA: Insufficient documentation

## 2020-03-15 DIAGNOSIS — Z87891 Personal history of nicotine dependence: Secondary | ICD-10-CM | POA: Diagnosis not present

## 2020-03-15 DIAGNOSIS — E079 Disorder of thyroid, unspecified: Secondary | ICD-10-CM | POA: Diagnosis not present

## 2020-03-15 DIAGNOSIS — F319 Bipolar disorder, unspecified: Secondary | ICD-10-CM | POA: Insufficient documentation

## 2020-03-15 DIAGNOSIS — K219 Gastro-esophageal reflux disease without esophagitis: Secondary | ICD-10-CM | POA: Diagnosis not present

## 2020-03-15 DIAGNOSIS — N939 Abnormal uterine and vaginal bleeding, unspecified: Secondary | ICD-10-CM

## 2020-03-15 DIAGNOSIS — Z801 Family history of malignant neoplasm of trachea, bronchus and lung: Secondary | ICD-10-CM | POA: Insufficient documentation

## 2020-03-15 DIAGNOSIS — N938 Other specified abnormal uterine and vaginal bleeding: Secondary | ICD-10-CM | POA: Insufficient documentation

## 2020-03-15 DIAGNOSIS — Z8349 Family history of other endocrine, nutritional and metabolic diseases: Secondary | ICD-10-CM | POA: Diagnosis not present

## 2020-03-15 HISTORY — PX: HYSTEROSCOPY WITH D & C: SHX1775

## 2020-03-15 LAB — POCT PREGNANCY, URINE: Preg Test, Ur: NEGATIVE

## 2020-03-15 SURGERY — DILATATION AND CURETTAGE /HYSTEROSCOPY
Anesthesia: General

## 2020-03-15 MED ORDER — MIDAZOLAM HCL 2 MG/2ML IJ SOLN
INTRAMUSCULAR | Status: DC | PRN
  Administered 2020-03-15: 2 mg via INTRAVENOUS

## 2020-03-15 MED ORDER — ORAL CARE MOUTH RINSE: 15.0000 mL | Freq: Once | OROMUCOSAL | Status: AC

## 2020-03-15 MED ORDER — PROPOFOL 10 MG/ML IV BOLUS
INTRAVENOUS | Status: AC
  Filled 2020-03-15: qty 40

## 2020-03-15 MED ORDER — LIDOCAINE HCL (CARDIAC) PF 100 MG/5ML IV SOSY
PREFILLED_SYRINGE | INTRAVENOUS | Status: DC | PRN
  Administered 2020-03-15: 60 mg via INTRAVENOUS

## 2020-03-15 MED ORDER — FAMOTIDINE 20 MG PO TABS
ORAL_TABLET | ORAL | Status: AC
  Administered 2020-03-15: 20 mg via ORAL
  Filled 2020-03-15: qty 1

## 2020-03-15 MED ORDER — MIDAZOLAM HCL 2 MG/2ML IJ SOLN
INTRAMUSCULAR | Status: AC
  Filled 2020-03-15: qty 2

## 2020-03-15 MED ORDER — IBUPROFEN 600 MG PO TABS: 600.0000 mg | ORAL_TABLET | Freq: Four times a day (QID) | ORAL | 3 refills | Status: DC | PRN

## 2020-03-15 MED ORDER — HYDROCODONE-ACETAMINOPHEN 5-325 MG PO TABS: 1.0000 | ORAL_TABLET | Freq: Four times a day (QID) | ORAL | 0 refills | Status: DC | PRN

## 2020-03-15 MED ORDER — FENTANYL CITRATE (PF) 250 MCG/5ML IJ SOLN
INTRAMUSCULAR | Status: AC
  Filled 2020-03-15: qty 5

## 2020-03-15 MED ORDER — GLYCOPYRROLATE 0.2 MG/ML IJ SOLN
INTRAMUSCULAR | Status: DC | PRN
  Administered 2020-03-15: .2 mg via INTRAVENOUS

## 2020-03-15 MED ORDER — FAMOTIDINE 20 MG PO TABS: 20.0000 mg | ORAL_TABLET | Freq: Once | ORAL | Status: AC

## 2020-03-15 MED ORDER — POVIDONE-IODINE 10 % EX SWAB: 2.0000 "application " | Freq: Once | CUTANEOUS | Status: DC

## 2020-03-15 MED ORDER — CHLORHEXIDINE GLUCONATE 0.12 % MT SOLN
OROMUCOSAL | Status: AC
  Administered 2020-03-15: 15 mL via OROMUCOSAL
  Filled 2020-03-15: qty 15

## 2020-03-15 MED ORDER — DEXMEDETOMIDINE HCL 200 MCG/2ML IV SOLN
INTRAVENOUS | Status: DC | PRN
  Administered 2020-03-15: 8 ug via INTRAVENOUS
  Administered 2020-03-15: 12 ug via INTRAVENOUS

## 2020-03-15 MED ORDER — DEXAMETHASONE SODIUM PHOSPHATE 10 MG/ML IJ SOLN
INTRAMUSCULAR | Status: DC | PRN
  Administered 2020-03-15: 10 mg via INTRAVENOUS

## 2020-03-15 MED ORDER — LACTATED RINGERS IV SOLN: INTRAVENOUS | Status: DC

## 2020-03-15 MED ORDER — CHLORHEXIDINE GLUCONATE 0.12 % MT SOLN: 15.0000 mL | Freq: Once | OROMUCOSAL | Status: AC

## 2020-03-15 MED ORDER — PROPOFOL 10 MG/ML IV BOLUS
INTRAVENOUS | Status: DC | PRN
  Administered 2020-03-15: 200 mg via INTRAVENOUS

## 2020-03-15 MED ORDER — EPHEDRINE SULFATE 50 MG/ML IJ SOLN
INTRAMUSCULAR | Status: DC | PRN
  Administered 2020-03-15: 5 mg via INTRAVENOUS

## 2020-03-15 MED ORDER — FENTANYL CITRATE (PF) 100 MCG/2ML IJ SOLN
INTRAMUSCULAR | Status: DC | PRN
Start: 2020-03-15 — End: 2020-03-15
  Administered 2020-03-15 (×2): 50 ug via INTRAVENOUS

## 2020-03-15 MED ORDER — ONDANSETRON HCL 4 MG/2ML IJ SOLN
INTRAMUSCULAR | Status: DC | PRN
  Administered 2020-03-15: 4 mg via INTRAVENOUS

## 2020-03-15 SURGICAL SUPPLY — 23 items
CATH ROBINSON RED A/P 16FR (CATHETERS) ×2 IMPLANT
COVER WAND RF STERILE (DRAPES) IMPLANT
DEVICE MYOSURE LITE (MISCELLANEOUS) IMPLANT
ELECT REM PT RETURN 9FT ADLT (ELECTROSURGICAL)
ELECTRODE REM PT RTRN 9FT ADLT (ELECTROSURGICAL) IMPLANT
GLOVE BIO SURGEON STRL SZ7 (GLOVE) ×2 IMPLANT
GLOVE INDICATOR 7.5 STRL GRN (GLOVE) ×2 IMPLANT
GOWN STRL REUS W/ TWL LRG LVL3 (GOWN DISPOSABLE) ×2 IMPLANT
GOWN STRL REUS W/TWL LRG LVL3 (GOWN DISPOSABLE) ×4
INFUSOR MANOMETER BAG 3000ML (MISCELLANEOUS) IMPLANT
IV LACTATED RINGER IRRG 3000ML (IV SOLUTION)
IV LR IRRIG 3000ML ARTHROMATIC (IV SOLUTION) IMPLANT
IV NS IRRIG 3000ML ARTHROMATIC (IV SOLUTION) ×2 IMPLANT
KIT PROCEDURE FLUENT (KITS) ×2 IMPLANT
KIT TURNOVER CYSTO (KITS) ×2 IMPLANT
MYOSURE XL FIBROID (MISCELLANEOUS)
PACK DNC HYST (MISCELLANEOUS) ×2 IMPLANT
PAD OB MATERNITY 4.3X12.25 (PERSONAL CARE ITEMS) ×2 IMPLANT
PAD PREP 24X41 OB/GYN DISP (PERSONAL CARE ITEMS) ×2 IMPLANT
SEAL ROD LENS SCOPE MYOSURE (ABLATOR) ×2 IMPLANT
SYSTEM TISS REMOVAL MYOSURE XL (MISCELLANEOUS) IMPLANT
TOWEL OR 17X26 4PK STRL BLUE (TOWEL DISPOSABLE) ×2 IMPLANT
TUBING CONNECTING 10 (TUBING) ×2 IMPLANT

## 2020-03-15 NOTE — Anesthesia Preprocedure Evaluation (Signed)
Anesthesia Evaluation  Patient identified by MRN, date of birth, ID band Patient awake    Reviewed: Allergy & Precautions, H&P , NPO status , Patient's Chart, lab work & pertinent test results, reviewed documented beta blocker date and time   History of Anesthesia Complications Negative for: history of anesthetic complications  Airway Mallampati: I  TM Distance: >3 FB Neck ROM: full    Dental  (+) Dental Advidsory Given, Teeth Intact   Pulmonary neg pulmonary ROS, former smoker,    Pulmonary exam normal breath sounds clear to auscultation       Cardiovascular Exercise Tolerance: Good negative cardio ROS Normal cardiovascular exam Rhythm:regular Rate:Normal     Neuro/Psych PSYCHIATRIC DISORDERS Anxiety Depression Bipolar Disorder negative neurological ROS     GI/Hepatic Neg liver ROS, GERD  ,  Endo/Other  neg diabetesHypothyroidism   Renal/GU Renal disease (kidney stones)  negative genitourinary   Musculoskeletal   Abdominal   Peds  Hematology negative hematology ROS (+)   Anesthesia Other Findings Past Medical History: No date: Bipolar disorder ([place]) No date: Depression No date: GERD (gastroesophageal reflux disease) No date: History of kidney stones     Comment:  H/O No date: Hypothyroidism No date: Pre-diabetes No date: Thyroid disease No date: Tobacco use   Reproductive/Obstetrics negative OB ROS                             Anesthesia Physical Anesthesia Plan  ASA: II  Anesthesia Plan: General   Post-op Pain Management:    Induction: Intravenous  PONV Risk Score and Plan: 3 and Ondansetron, Dexamethasone, Midazolam, Promethazine and Treatment may vary due to age or medical condition  Airway Management Planned: LMA  Additional Equipment:   Intra-op Plan:   Post-operative Plan: Extubation in OR  Informed Consent: I have reviewed the patients History and Physical,  chart, labs and discussed the procedure including the risks, benefits and alternatives for the proposed anesthesia with the patient or authorized representative who has indicated his/her understanding and acceptance.     Dental Advisory Given  Plan Discussed with: Anesthesiologist, CRNA and Surgeon  Anesthesia Plan Comments:         Anesthesia Quick Evaluation

## 2020-03-15 NOTE — Transfer of Care (Signed)
Immediate Anesthesia Transfer of Care Note  Patient: Donna Cox  Procedure(s) Performed: DILATATION AND CURETTAGE /HYSTEROSCOPY (N/A )  Patient Location: PACU  Anesthesia Type:General  Level of Consciousness: awake, alert  and oriented  Airway & Oxygen Therapy: Patient connected to face mask oxygen  Post-op Assessment: Report given to RN and Post -op Vital signs reviewed and stable  Post vital signs: Reviewed and stable  Last Vitals:  Vitals Value Taken Time  BP 132/82 03/15/20 1333  Temp    Pulse 91 03/15/20 1333  Resp 14 03/15/20 1333  SpO2 100 % 03/15/20 1333  Vitals shown include unvalidated device data.  Last Pain:  Vitals:   03/15/20 1057  TempSrc: Temporal  PainSc: 0-No pain         Complications: No complications documented.

## 2020-03-15 NOTE — Interval H&P Note (Signed)
History and Physical Interval Note:  03/15/2020 12:21 PM  Donna Cox  has presented today for surgery, with the diagnosis of Thickened endometrium R93.89.  The various methods of treatment have been discussed with the patient and family. After consideration of risks, benefits and other options for treatment, the patient has consented to  Procedure(s): DILATATION AND CURETTAGE /HYSTEROSCOPY (N/A) as a surgical intervention.  The patient's history has been reviewed, patient examined, no change in status, stable for surgery.  I have reviewed the patient's chart and labs.  Questions were answered to the patient's satisfaction.     [name]

## 2020-03-15 NOTE — OR Nursing (Signed)
Per PACU RN, both Norco and Ibuprofen will be escripted to pt's pharmacy per Dr. Georgianne Fick.  No written rx in chart for Norco  Patient notified of same on discharge instructions.

## 2020-03-15 NOTE — Op Note (Signed)
Preoperative Diagnosis: 1) 29 y.o. with abnormal uterine bleeding 2) Thickened endometrium  Postoperative Diagnosis: 1) 29 y.o. with with abnormal uterine bleeding 2) Thickened endometrium  Operation Performed: Hysteroscopy, dilation and curettage  Indication: Abnormal bleeding unresponsive to medical therapy and thickened endometrium on ultrasound  Anesthesia: General  Primary Surgeon: [name], MD  Assistant: none  Preoperative Antibiotics: none  Estimated Blood Loss: 5 mL  IV Fluids: 1L  Urine Output:: ~69mL straight cath  Drains or Tubes: none  Implants: none  Specimens Removed: endometrial curettings  Complications: none  Intraoperative Findings: Normal cervix.  The endometrial cavity showed significantly thickened endometrium.  Following sharp curettage the cavity was able to be visualized and appeared normal.  The posterior portion of the endometrial lining appeared thickened following the curettage and was further thinned out with Myosure device.    Patient Condition: stable  Procedure in Detail:  Patient was taken to the operating room were she was administered general endotracheal anesthesia.  She was positioned in the dorsal lithotomy position utilizing Allen stirups, prepped and draped in the usual sterile fashion.  Uterus was noted to be anteverted, non-enlarged in size.   Prior to proceeding with the case a time out was performed.  Attention was turned to the patient's pelvis.  A red rubber catheter was used to empty the patient's bladder.  An operative speculum was placed to allow visualization of the cervix.  The anterior lip of the cervix was grasped with a single tooth tenaculum and the cervix was sequentially dilated using pratt dilators.  The hysteroscope was then advanced into the uterine cavity noting the above findings.  Sharp curettage was performed and the resulting specimen collected and sent to pathology.  The posterior portion of endometrium  still appeared thickened and was thinned out with a Myosure device  The single tooth tenaculum was removed from the cervix.  The tenaculum sites and cervix were noted to be  Hemostatic before removing the operative speculum.  Sponge needle and instrument counts were corrects times two.  The patient tolerated the procedure well and was taken to the recovery room in stable condition.

## 2020-03-15 NOTE — Anesthesia Procedure Notes (Signed)
Procedure Name: LMA Insertion Performed by: Staci Acosta, CRNA Pre-anesthesia Checklist: Patient identified, Patient being monitored, Timeout performed, Emergency Drugs available and Suction available Patient Re-evaluated:Patient Re-evaluated prior to induction Oxygen Delivery Method: Circle system utilized Preoxygenation: Pre-oxygenation with 100% oxygen Induction Type: IV induction Ventilation: Mask ventilation without difficulty LMA: LMA inserted LMA Size: 4.0 Tube type: Oral Number of attempts: 1 Placement Confirmation: positive ETCO2 and breath sounds checked- equal and bilateral Tube secured with: Tape Dental Injury: Teeth and Oropharynx as per pre-operative assessment

## 2020-03-15 NOTE — Discharge Instructions (Addendum)
Both ibuprofen and Norco will be computer sent to your pharmacy.  (No written script given to you from MD)    AMBULATORY SURGERY  DISCHARGE INSTRUCTIONS   1) The drugs that you were given will stay in your system until tomorrow so for the next 24 hours you should not:  A) Drive an automobile B) Make any legal decisions C) Drink any alcoholic beverage   2) You may resume regular meals tomorrow.  Today it is better to start with liquids and gradually work up to solid foods.  You may eat anything you prefer, but it is better to start with liquids, then soup and crackers, and gradually work up to solid foods.   3) Please notify your doctor immediately if you have any unusual bleeding, trouble breathing, redness and pain at the surgery site, drainage, fever, or pain not relieved by medication.    4) Additional Instructions:        Please contact your physician with any problems or Same Day Surgery at [phone], Monday through Friday 6 am to 4 pm, or [place] [facility] number at [phone].

## 2020-03-16 ENCOUNTER — Encounter: Payer: Self-pay | Admitting: Obstetrics and Gynecology

## 2020-03-16 LAB — SURGICAL PATHOLOGY

## 2020-03-16 NOTE — Anesthesia Postprocedure Evaluation (Signed)
Anesthesia Post Note  Patient: Betsy Pries  Procedure(s) Performed: DILATATION AND CURETTAGE /HYSTEROSCOPY (N/A )  Patient location during evaluation: PACU Anesthesia Type: General Level of consciousness: awake and alert Pain management: pain level controlled Vital Signs Assessment: post-procedure vital signs reviewed and stable Respiratory status: spontaneous breathing, nonlabored ventilation, respiratory function stable and patient connected to nasal cannula oxygen Cardiovascular status: blood pressure returned to baseline and stable Postop Assessment: no apparent nausea or vomiting Anesthetic complications: no   No complications documented.   Last Vitals:  Vitals:   03/15/20 1415 03/15/20 1428  BP: 113/66 120/74  Pulse: 88 74  Resp: 16 16  Temp: (!) 36.1 C (!) 35.8 C  SpO2: 99% 100%    Last Pain:  Vitals:   03/16/20 0807  TempSrc:   PainSc: 2                  Martha Clan

## 2020-03-23 ENCOUNTER — Ambulatory Visit: Payer: 59 | Admitting: Obstetrics and Gynecology

## 2020-03-29 ENCOUNTER — Other Ambulatory Visit: Payer: Self-pay

## 2020-03-29 ENCOUNTER — Ambulatory Visit (INDEPENDENT_AMBULATORY_CARE_PROVIDER_SITE_OTHER): Payer: 59 | Admitting: Obstetrics and Gynecology

## 2020-03-29 ENCOUNTER — Encounter: Payer: Self-pay | Admitting: Obstetrics and Gynecology

## 2020-03-29 VITALS — BP 130/85 | HR 72 | Wt 237.0 lb

## 2020-03-29 DIAGNOSIS — Z4889 Encounter for other specified surgical aftercare: Secondary | ICD-10-CM

## 2020-03-29 MED ORDER — MEDROXYPROGESTERONE ACETATE 10 MG PO TABS: ORAL_TABLET | ORAL | 0 refills | Status: DC

## 2020-04-06 NOTE — Progress Notes (Signed)
      Postoperative Follow-up Patient presents post op from hsyteroscopy D&C 1weeks ago for abnormal uterine bleeding.  Subjective: Patient reports some improvement in her preop symptoms. Eating a regular diet without difficulty. The patient is not having any pain.  Activity: normal activities of daily living.  Bleeding has slowed down since procedure.    Objective: Blood pressure 130/85, pulse 72, weight 237 lb (107.5 kg), unknown if currently breastfeeding.  General: NAD Pulmonary: no increased work of breathing Extremities: no edema Neurologic: normal gait    Admission on 03/15/2020, Discharged on 03/15/2020  Component Date Value Ref Range Status  . Preg Test, Ur 03/15/2020 NEGATIVE  NEGATIVE Final   Comment:        THE SENSITIVITY OF THIS METHODOLOGY IS >24 mIU/mL   . SURGICAL PATHOLOGY 03/15/2020    Final-Edited                   Value:SURGICAL PATHOLOGY CASE: [id number] PATIENT: [facility] Surgical Pathology Report     Specimen Submitted: A. Endometrium; curettage  Clinical History: Thickened endometrium R93.89    DIAGNOSIS: A. ENDOMETRIUM; CURETTAGE: - DISORDERED PROLIFERATIVE ENDOMETRIUM WITH GLANDULAR AND STROMAL BREAKDOWN. - PORTIONS OF BENIGN ENDOCERVICAL GLANDULAR TISSUE. - NEGATIVE FOR ATYPICAL HYPERPLASIA/EIN, DYSPLASIA, AND MALIGNANCY.  GROSS DESCRIPTION: A. Labeled: Endometrial curettings Received: In formalin Tissue fragment(s): Multiple Size: 2.5 x 2.5 x 0.8 cm Description: Aggregate of pink-red soft tissue with blood clot Entirely submitted in 3 cassettes.   Final Diagnosis performed by Allena Napoleon, MD.   Electronically signed 03/16/2020 10:41:26AM The electronic signature indicates that the named Attending Pathologist has evaluated the specimen Technical component performed at [facility], [address], [place], [place] [address] Lab: [id number] Dir: Louretta Shorten, MMM  Professional component performed  at [facility], [facility], [place], [place], [place] [address] Lab: [phone] Dir: Dellia Nims. Reuel Derby, MD     Assessment: 29 y.o. s/p hysteroscopy D&C stable  Plan: Patient has done well after surgery with no apparent complications.  I have discussed the post-operative course to date, and the expected progress moving forward.  The patient understands what complications to be concerned about.  I will see the patient in routine follow up, or sooner if needed.    Activity plan: No restriction.   [name], MD, [facility], [place]

## 2020-04-30 ENCOUNTER — Encounter: Payer: 59 | Admitting: Obstetrics and Gynecology

## 2020-04-30 ENCOUNTER — Telehealth: Payer: Self-pay | Admitting: Obstetrics and Gynecology

## 2020-04-30 NOTE — Telephone Encounter (Signed)
This is AMS patient. I do not have any forms for this pt.

## 2020-04-30 NOTE — Telephone Encounter (Signed)
Patient following up on exemption forms due tomorrow , can you follow up when finished.

## 2020-05-01 NOTE — Telephone Encounter (Signed)
Exemption form for [employer] filled out, signature obtained and faxed to [phone].

## 2020-05-02 ENCOUNTER — Telehealth: Payer: Self-pay | Admitting: Obstetrics and Gynecology

## 2020-05-02 NOTE — Telephone Encounter (Signed)
Is this the one you helped with and filled out?

## 2020-05-02 NOTE — Telephone Encounter (Signed)
PT called stating medical accomodation form that was faxed yesterday was not filled out completely number 4 was left blank, her employer denied her because of this . She needs this in as soon as possible.

## 2020-05-03 NOTE — Telephone Encounter (Signed)
ammended and faxed

## 2020-05-07 ENCOUNTER — Telehealth: Payer: Self-pay

## 2020-05-07 NOTE — Telephone Encounter (Signed)
Patient missed her 6 wk post op apt last Monday d/t not being able to get off work. She has r/s to 12/3 (next available). Reports she has started to bleed heavy once again and passing bigger clots and in a little bit of pain. Inquiring if she can be seen sooner. NT#[phone]

## 2020-05-07 NOTE — Telephone Encounter (Signed)
Spoke w/patient. She is changing her pad 1-2xqhr. The pad isn't completely full, mostly changes d/t clot/s. States the smallest clot is golf ball size and largest about 3/4 of a baseball. Advised on heavy bleeding protocol and when to report to ED. She will await AMS response/advice.

## 2020-05-08 ENCOUNTER — Other Ambulatory Visit: Payer: Self-pay

## 2020-05-08 ENCOUNTER — Other Ambulatory Visit: Payer: 59

## 2020-05-08 ENCOUNTER — Other Ambulatory Visit: Payer: Self-pay | Admitting: Obstetrics and Gynecology

## 2020-05-08 DIAGNOSIS — O3680X0 Pregnancy with inconclusive fetal viability, not applicable or unspecified: Secondary | ICD-10-CM

## 2020-05-08 MED ORDER — MEDROXYPROGESTERONE ACETATE 10 MG PO TABS
ORAL_TABLET | ORAL | 0 refills | Status: DC
Start: 2020-05-08 — End: 2020-06-13

## 2020-05-08 NOTE — Progress Notes (Unsigned)
Provera 10 day course did have positive ovualtion predictor kit and faint positive pregnancy test and then negative.  Will check HCG is interested in restarting clomid vs letrozole potentially

## 2020-05-08 NOTE — Telephone Encounter (Signed)
HCG later today if possible order is in

## 2020-05-08 NOTE — Telephone Encounter (Signed)
Patient aware. Scheduled lab apt @2 :40 today.

## 2020-05-09 LAB — BETA HCG QUANT (REF LAB): hCG Quant: <1 m[IU]/mL

## 2020-05-22 ENCOUNTER — Other Ambulatory Visit: Payer: Self-pay | Admitting: Obstetrics and Gynecology

## 2020-05-22 DIAGNOSIS — N970 Female infertility associated with anovulation: Secondary | ICD-10-CM

## 2020-05-22 MED ORDER — LETROZOLE 2.5 MG PO TABS: 5.0000 mg | ORAL_TABLET | Freq: Every day | ORAL | 0 refills | Status: DC

## 2020-05-22 NOTE — Telephone Encounter (Signed)
Patient is scheduled for 06/11/20 at 2:40 for labs

## 2020-05-22 NOTE — Telephone Encounter (Signed)
Day 21 progesteron Monday 06/11/2020

## 2020-05-25 ENCOUNTER — Ambulatory Visit: Payer: 59 | Admitting: Obstetrics and Gynecology

## 2020-05-29 ENCOUNTER — Telehealth: Payer: Self-pay

## 2020-05-29 NOTE — Telephone Encounter (Signed)
Called pt to schedule no answer left vm  

## 2020-05-29 NOTE — Telephone Encounter (Signed)
Please see about getting patient scheduled.  

## 2020-05-29 NOTE — Telephone Encounter (Signed)
Please advise 

## 2020-05-29 NOTE — Telephone Encounter (Signed)
Copied from [place] [id number]. Topic: Appointment Scheduling - Scheduling Inquiry for Clinic >> May 29, 2020  2:50 PM Scherrie Gerlach wrote: Reason for CRM: pt wants to come in office to have her lungs checked. [facility] went to [facility] and they heard PNA in left lobe, and told her to follow up with her PCP to get rechecked.  Pt states she cannot keep going to [facility].  Pt has congestion, sore throat, and its hard to take deep breathe.  Pt wants to come in Thurs for appt. Pt states she has had 2 negative covid test.

## 2020-05-29 NOTE — Telephone Encounter (Signed)
Is okay for in office, just alert her to ensure wearing mask in office -- we will put all attire for this to ensure patient and staff safety.  May need chest x-ray if has not obtained yet.  If opening available with provider Thursday can see in office.

## 2020-05-30 NOTE — Telephone Encounter (Signed)
Pt has an appt with jolene on 06-01-2020. Pt states she has starting feeling better

## 2020-06-01 ENCOUNTER — Ambulatory Visit: Payer: 59 | Admitting: Nurse Practitioner

## 2020-06-04 ENCOUNTER — Ambulatory Visit: Payer: 59 | Admitting: Unknown Physician Specialty

## 2020-06-11 ENCOUNTER — Other Ambulatory Visit: Payer: Self-pay

## 2020-06-11 ENCOUNTER — Other Ambulatory Visit: Payer: 59

## 2020-06-11 DIAGNOSIS — N970 Female infertility associated with anovulation: Secondary | ICD-10-CM

## 2020-06-12 LAB — PROGESTERONE: Progesterone: 0.2 ng/mL

## 2020-06-13 ENCOUNTER — Other Ambulatory Visit: Payer: Self-pay | Admitting: Obstetrics and Gynecology

## 2020-06-13 MED ORDER — MEDROXYPROGESTERONE ACETATE 10 MG PO TABS: 10.0000 mg | ORAL_TABLET | Freq: Every day | ORAL | 0 refills | Status: DC

## 2020-06-13 MED ORDER — METFORMIN HCL 500 MG PO TABS: 500.0000 mg | ORAL_TABLET | Freq: Two times a day (BID) | ORAL | 6 refills | Status: DC

## 2020-06-13 MED ORDER — LETROZOLE 2.5 MG PO TABS: 7.5000 mg | ORAL_TABLET | Freq: Every day | ORAL | 0 refills | Status: AC

## 2020-07-03 ENCOUNTER — Other Ambulatory Visit: Payer: Self-pay | Admitting: Obstetrics and Gynecology

## 2020-07-03 DIAGNOSIS — E282 Polycystic ovarian syndrome: Secondary | ICD-10-CM

## 2020-07-03 DIAGNOSIS — N970 Female infertility associated with anovulation: Secondary | ICD-10-CM

## 2020-07-03 NOTE — Telephone Encounter (Signed)
Progesterone lab 07/16/2020 order is in

## 2020-07-03 NOTE — Telephone Encounter (Signed)
Called spoke with patient. We have her scheduled for 2:40 on 07/16/20 for labs.

## 2020-07-03 NOTE — Progress Notes (Signed)
LMP 06/25/2019 Letrozole 7.5mg  and metformin 500mg  bid day 21 progesterone1/24/2021

## 2020-07-16 ENCOUNTER — Other Ambulatory Visit: Payer: Self-pay

## 2020-07-16 ENCOUNTER — Other Ambulatory Visit: Payer: 59

## 2020-07-16 DIAGNOSIS — N970 Female infertility associated with anovulation: Secondary | ICD-10-CM

## 2020-07-16 DIAGNOSIS — E282 Polycystic ovarian syndrome: Secondary | ICD-10-CM

## 2020-07-18 LAB — PROGESTERONE: Progesterone: 0.1 ng/mL

## 2020-07-31 ENCOUNTER — Other Ambulatory Visit: Payer: Self-pay | Admitting: Obstetrics and Gynecology

## 2020-07-31 DIAGNOSIS — E669 Obesity, unspecified: Secondary | ICD-10-CM

## 2020-07-31 MED ORDER — PHENTERMINE HCL 37.5 MG PO TABS: 37.5000 mg | ORAL_TABLET | Freq: Every day | ORAL | 0 refills | Status: DC

## 2020-07-31 NOTE — Progress Notes (Signed)
Will start phentermine to achieve weight loss before continuing with letrozole cycles

## 2020-07-31 NOTE — Telephone Encounter (Signed)
Weight loss follow up 4 weeks

## 2020-08-30 ENCOUNTER — Ambulatory Visit: Payer: 59 | Admitting: Obstetrics and Gynecology

## 2020-08-30 ENCOUNTER — Encounter: Payer: Self-pay | Admitting: Obstetrics and Gynecology

## 2020-08-30 ENCOUNTER — Ambulatory Visit (INDEPENDENT_AMBULATORY_CARE_PROVIDER_SITE_OTHER): Payer: 59 | Admitting: Obstetrics and Gynecology

## 2020-08-30 VITALS — BP 128/74 | Ht 67.0 in | Wt 237.1 lb

## 2020-08-30 DIAGNOSIS — D171 Benign lipomatous neoplasm of skin and subcutaneous tissue of trunk: Secondary | ICD-10-CM | POA: Diagnosis not present

## 2020-08-30 DIAGNOSIS — Z6837 Body mass index (BMI) 37.0-37.9, adult: Secondary | ICD-10-CM

## 2020-08-30 DIAGNOSIS — E669 Obesity, unspecified: Secondary | ICD-10-CM | POA: Diagnosis not present

## 2020-08-30 DIAGNOSIS — E66812 Obesity, class 2: Secondary | ICD-10-CM

## 2020-08-30 NOTE — Progress Notes (Signed)
Gynecology Office Visit  Chief Complaint:  Chief Complaint  Patient presents with  . Follow-up    F/U weight loss. RM 5    History of Present Illness: Patientis a 30 y.o. [id number] female, who presents for the evaluation of the desire to lose weight. She has lost 5 pounds 1 months. The patient states the following symptoms since starting her weight loss therapy: appetite suppression, energy, and weight loss.  The patient also reports no other ill effects. The patient specifically denies heart palpitations, anxiety, and insomnia.   No spontaneous menses this month  Also reports lump left lower chest/ribcage.  None painful.  Review of Systems: 10 point review of systems negative unless otherwise noted in HPI  Past Medical History:  Past Medical History:  Diagnosis Date  . Bipolar disorder ([place])   . Depression   . GERD (gastroesophageal reflux disease)   . History of kidney stones    H/O  . Hypothyroidism   . Pre-diabetes   . Thyroid disease   . Tobacco use     Past Surgical History:  Past Surgical History:  Procedure Laterality Date  . HYSTEROSCOPY WITH D & C N/A 03/15/2020   Procedure: DILATATION AND CURETTAGE /HYSTEROSCOPY;  Surgeon: [name], MD;  Location: [facility] ORS;  Service: Gynecology;  Laterality: N/A;  . MOUTH SURGERY      Gynecologic History: No LMP recorded. (Menstrual status: Irregular Periods).  Obstetric History: [id number]  Family History:  Family History  Problem Relation Age of Onset  . Mental illness Mother   . Diabetes Mother   . Thyroid disease Mother   . Heart disease Maternal Grandmother   . Heart disease Maternal Grandfather   . Cancer Maternal Grandfather        lung    Social History:  Social History   Socioeconomic History  . Marital status: Married    Spouse name: Not on file  . Number of children: Not on file  . Years of education: Not on file  . Highest education level: Not on file  Occupational History    Employer:  [employer]  Tobacco Use  . Smoking status: Former Smoker    Packs/day: 1.50    Years: 6.00    Pack years: 9.00    Types: Cigarettes    Quit date: 06/23/2014    Years since quitting: 6.1  . Smokeless tobacco: Never Used  Vaping Use  . Vaping Use: Some days  . Substances: Flavoring  Substance and Sexual Activity  . Alcohol use: Yes    Comment: rare occasion  . Drug use: No  . Sexual activity: Yes    Birth control/protection: None  Other Topics Concern  . Not on file  Social History Narrative  . Not on file   Social Determinants of Health   Financial Resource Strain: Not on file  Food Insecurity: Not on file  Transportation Needs: Not on file  Physical Activity: Not on file  Stress: Not on file  Social Connections: Not on file  Intimate Partner Violence: Not on file    Allergies:  No Known Allergies  Medications: Prior to Admission medications   Medication Sig Start Date End Date Taking? Authorizing Gordon Carlson  levothyroxine (SYNTHROID) 50 MCG tablet Take 1 tablet (50 mcg total) by mouth daily before breakfast. 03/08/20  Yes [name], MD  phentermine (ADIPEX-P) 37.5 MG tablet Take 1 tablet (37.5 mg total) by mouth daily before breakfast. 07/31/20  Yes [name], MD  aspirin-acetaminophen-caffeine [facility]  MIGRAINE) 250-250-65 MG tablet Take 2 tablets by mouth every 6 (six) hours as needed for headache.    Ryszard Socarras, Historical, MD  medroxyPROGESTERone (PROVERA) 10 MG tablet Take 1 tablet (10 mg total) by mouth daily. Patient not taking: Reported on 08/30/2020 06/13/20   [name], MD  metFORMIN (GLUCOPHAGE) 500 MG tablet Take 1 tablet (500 mg total) by mouth 2 (two) times daily with a meal. Patient not taking: Reported on 08/30/2020 06/13/20   [name], MD  Prenatal Vit-Fe Fumarate-FA (PRENATAL VITAMIN PO) Take 1 tablet by mouth daily.  Patient not taking: Reported on 08/30/2020    Meko Bellanger, Historical, MD    Physical Exam Blood  pressure 128/74, height 5\' 7"  (1.702 m), weight 237 lb 2 oz (107.6 kg), unknown if currently breastfeeding. Wt Readings from Last 3 Encounters:  08/30/20 237 lb 2 oz (107.6 kg)  03/29/20 237 lb (107.5 kg)  03/15/20 238 lb (108 kg)  Body mass index is 37.14 kg/m.   General: NAD HEENT: normocephalic, anicteric Chest: below left rift cage small mobile 2cm well circumscribed mobile mass, non-tender Pulmonary: no increased work of breathing Neurologic: Grossly intact Psychiatric: mood appropriate, affect full  Assessment: 30 y.o. [id number] medical weight loss follow up  Plan: Problem List Items Addressed This Visit   None   Visit Diagnoses    Class 2 obesity without serious comorbidity with body mass index (BMI) of 37.0 to 37.9 in adult, unspecified obesity type    -  Primary   Lipoma of anterior chest wall          1) 1500 Calorie ADA Diet  2) Patient education given regarding appropriate lifestyle changes for weight loss including: regular physical activity, healthy coping strategies, caloric restriction and healthy eating patterns.  3) Patient will be started on weight loss medication. The risks and benefits and side effects of medication, such as Adipex (Phenteramine) ,  Tenuate (Diethylproprion), Belviq (lorcarsin), Contrave (buproprion/naltrexone), Qsymia (phentermine/topiramate), and Saxenda (liraglutide) is discussed. The pros and cons of suppressing appetite and boosting metabolism is discussed. Risks of tolerence and addiction is discussed for selected agents discussed. Use of medicine will ne short term, such as 3-4 months at a time followed by a period of time off of the medicine to avoid these risks and side effects for Adipex, Qsymia, and Tenuate discussed. Pt to call with any negative side effects and agrees to keep follow up appts. - continue phentermine   4) Patient to take medication, with the benefits of appetite suppression and metabolism boost d/w pt, along with the  side effects and risk factors of long term use that will be avoided with our use of short bursts of therapy. Rx provided.    5) 15 minutes face-to-face; with counseling/coordination of care > 50 percent of visit related to obesity and ongoing management/treatment   6)  Return in about 4 weeks (around 09/27/2020) for medication follow up.    [name], MD, [facility], [facility] 08/30/2020, 4:36 PM

## 2020-09-06 ENCOUNTER — Other Ambulatory Visit: Payer: Self-pay | Admitting: Obstetrics and Gynecology

## 2020-09-06 ENCOUNTER — Telehealth (INDEPENDENT_AMBULATORY_CARE_PROVIDER_SITE_OTHER): Payer: 59 | Admitting: Nurse Practitioner

## 2020-09-06 ENCOUNTER — Encounter: Payer: Self-pay | Admitting: Nurse Practitioner

## 2020-09-06 ENCOUNTER — Other Ambulatory Visit: Payer: Self-pay

## 2020-09-06 DIAGNOSIS — F419 Anxiety disorder, unspecified: Secondary | ICD-10-CM | POA: Diagnosis not present

## 2020-09-06 MED ORDER — SERTRALINE HCL 25 MG PO TABS: 25.0000 mg | ORAL_TABLET | Freq: Every day | ORAL | 0 refills | Status: DC

## 2020-09-06 MED ORDER — PHENTERMINE HCL 37.5 MG PO TABS: 37.5000 mg | ORAL_TABLET | Freq: Every day | ORAL | 0 refills | Status: DC

## 2020-09-06 NOTE — Assessment & Plan Note (Signed)
Begin Zoloft daily.  Side effects and benefits of medication discussed with patient at visit today.  Recommend patient seek assistance through counseling.  Return to clinic in 2 weeks for reevaluation.

## 2020-09-06 NOTE — Telephone Encounter (Signed)
Please advise 

## 2020-09-06 NOTE — Progress Notes (Signed)
There were no vitals taken for this visit.   Subjective:    Patient ID: Donna Cox, female    DOB: 1991/01/13, 30 y.o.   MRN: [id number]  HPI: Donna Cox is a 30 y.o. female  Chief Complaint  Patient presents with  . Anxiety    Patient states she was told she was bipolar by Merrie Roof. Patient states she is very jittery and anxious and believes she is dealing with anxiety.    ANXIETY/STRESS Patient states that she feels like she is on a roller coaster over the last 6 months.  She has had many life changes that have caused her a lot of stress.  Duration:uncontrolled Anxious mood: yes  Excessive worrying: yes Irritability: yes  Sweating: no Nausea: no Palpitations:no Hyperventilation: no Panic attacks: no Agoraphobia: no  Obscessions/compulsions: no Depressed mood: yes Anhedonia: no Weight changes: yes Insomnia: no .  Hypersomnia: no Recent Stressors/Life Changes: moving, miscarriages.   Relationship problems: no   Family stress: yes     Financial stress: no    Job stress: no    Recent death/loss: no  GAD 7 : Generalized Anxiety Score 09/06/2020 02/16/2020  Nervous, Anxious, on Edge 1 3  Control/stop worrying 2 3  Worry too much - different things 2 3  Trouble relaxing 1 3  Restless 1 1  Easily annoyed or irritable 2 3  Afraid - awful might happen 0 0  Total GAD 7 Score 9 16  Anxiety Difficulty Somewhat difficult Very difficult     Relevant past medical, surgical, family and social history reviewed and updated as indicated. Interim medical history since our last visit reviewed. Allergies and medications reviewed and updated.  Review of Systems  Psychiatric/Behavioral: Positive for dysphoric mood. Negative for suicidal ideas. The patient is not nervous/anxious.     Per HPI unless specifically indicated above     Objective:    There were no vitals taken for this visit.  Wt Readings from Last 3 Encounters:  08/30/20 237 lb 2 oz (107.6 kg)  03/29/20 237  lb (107.5 kg)  03/15/20 238 lb (108 kg)    Physical Exam Vitals and nursing note reviewed.  HENT:     Head: Normocephalic.     Right Ear: Hearing normal.     Left Ear: Hearing normal.     Nose: Nose normal.  Eyes:     Pupils: Pupils are equal, round, and reactive to light.  Pulmonary:     Effort: Pulmonary effort is normal. No respiratory distress.  Neurological:     Mental Status: She is alert.  Psychiatric:        Mood and Affect: Mood normal.        Behavior: Behavior normal.        Thought Content: Thought content normal.        Judgment: Judgment normal.     Results for orders placed or performed in visit on 07/16/20  Progesterone  Result Value Ref Range   Progesterone 0.1 ng/mL      Assessment & Plan:   Problem List Items Addressed This Visit      Other   Anxiety - Primary    Begin Zoloft daily.  Side effects and benefits of medication discussed with patient at visit today.  Recommend patient seek assistance through counseling.  Return to clinic in 2 weeks for reevaluation.       Relevant Medications   sertraline (ZOLOFT) 25 MG tablet   Other Relevant Orders   Ambulatory  referral to Psychology       Follow up plan: Return in about 2 weeks (around 09/20/2020) for Depression/Anxiety FU (telehealth).   This visit was completed via MyChart due to the restrictions of the COVID-19 pandemic. All issues as above were discussed and addressed. Physical exam was done as above through visual confirmation on MyChart. If it was felt that the patient should be evaluated in the office, they were directed there. The patient verbally consented to this visit. 1. Location of the patient: Home 2. Location of the provider: Office 3. Those involved with this call:  ? Provider: Jon Billings, NP ? CMA: Lowry Ram, CMA ? Front Desk/Registration: Jill Side 4. Time spent on call: 20 minutes with patient face to face via video conference. More than 50% of this time was  spent in counseling and coordination of care. 30 minutes total spent in review of patient's record and preparation of their chart.

## 2020-09-17 NOTE — Progress Notes (Signed)
There were no vitals taken for this visit.   Subjective:    Patient ID: Donna Cox, female    DOB: Oct 04, 1990, 30 y.o.   MRN: [id number]  HPI: Donna Cox is a 30 y.o. female  Chief Complaint  Patient presents with  . Depression  . Anxiety   ANXIETY/STRESS Patient started the Zoloft and had worsening anxiety over the last two weeks.  Patient had a hard time sitting still and was only sleeping about 4 hours a night.  However, on Monday she noticed an improvement in her anxiety and depression.  She feels like she is getting used to the medication and states that the side effects are improving.  Duration:uncontrolled Anxious mood: yes  Excessive worrying: yes Irritability: yes  Sweating: no Nausea: no Palpitations:no Hyperventilation: no Panic attacks: no Agoraphobia: no  Obscessions/compulsions: no Depressed mood: yes Anhedonia: no Weight changes: yes Insomnia: no .  Hypersomnia: no Recent Stressors/Life Changes: moving, miscarriages.   Relationship problems: no   Family stress: yes     Financial stress: no    Job stress: no    Recent death/loss: no  GAD 7 : Generalized Anxiety Score 09/19/2020 09/06/2020 02/16/2020  Nervous, Anxious, on Edge 3 1 3   Control/stop worrying 3 2 3   Worry too much - different things 3 2 3   Trouble relaxing 3 1 3   Restless 0 1 1  Easily annoyed or irritable 0 2 3  Afraid - awful might happen 0 0 0  Total GAD 7 Score 12 9 16   Anxiety Difficulty Not difficult at all Somewhat difficult Very difficult   Flowsheet Row Video Visit from 09/19/2020 in [facility]  PHQ-9 Total Score 15       Relevant past medical, surgical, family and social history reviewed and updated as indicated. Interim medical history since our last visit reviewed. Allergies and medications reviewed and updated.  Review of Systems  Psychiatric/Behavioral: Positive for dysphoric mood. Negative for suicidal ideas. The patient is not nervous/anxious.      Per HPI unless specifically indicated above     Objective:    There were no vitals taken for this visit.  Wt Readings from Last 3 Encounters:  08/30/20 237 lb 2 oz (107.6 kg)  03/29/20 237 lb (107.5 kg)  03/15/20 238 lb (108 kg)    Physical Exam Vitals and nursing note reviewed.  HENT:     Head: Normocephalic.     Right Ear: Hearing normal.     Left Ear: Hearing normal.     Nose: Nose normal.  Eyes:     Pupils: Pupils are equal, round, and reactive to light.  Pulmonary:     Effort: Pulmonary effort is normal. No respiratory distress.  Neurological:     Mental Status: She is alert.  Psychiatric:        Mood and Affect: Mood normal.        Behavior: Behavior normal.        Thought Content: Thought content normal.        Judgment: Judgment normal.     Results for orders placed or performed in visit on 07/16/20  Progesterone  Result Value Ref Range   Progesterone 0.1 ng/mL      Assessment & Plan:   Problem List Items Addressed This Visit      Other   Anxiety and depression - Primary    Chronic.  Ongoing.  Will continue with the current dose of Zoloft due to patient having side effects  until 2 days ago.  Will give Buspar to use PRN for anxiety. Discussed increasing Zoloft at next visit if depression and anxiety are still not well controlled.       Relevant Medications   busPIRone (BUSPAR) 5 MG tablet       Follow up plan: Return in about 2 weeks (around 10/03/2020) for Depression/Anxiety FU (virtual).   This visit was completed via MyChart due to the restrictions of the COVID-19 pandemic. All issues as above were discussed and addressed. Physical exam was done as above through visual confirmation on MyChart. If it was felt that the patient should be evaluated in the office, they were directed there. The patient verbally consented to this visit. 1. Location of the patient: Home 2. Location of the provider: Office 3. Those involved with this call:   ? Provider: Jon Billings, NP ? CMA: Lowry Ram, CMA ? Front Desk/Registration: Jill Side 4. Time spent on call: 20 minutes with patient face to face via video conference. More than 50% of this time was spent in counseling and coordination of care. 30 minutes total spent in review of patient's record and preparation of their chart.

## 2020-09-19 ENCOUNTER — Encounter: Payer: Self-pay | Admitting: Nurse Practitioner

## 2020-09-19 ENCOUNTER — Telehealth (INDEPENDENT_AMBULATORY_CARE_PROVIDER_SITE_OTHER): Payer: 59 | Admitting: Nurse Practitioner

## 2020-09-19 ENCOUNTER — Telehealth: Payer: Self-pay

## 2020-09-19 DIAGNOSIS — F419 Anxiety disorder, unspecified: Secondary | ICD-10-CM | POA: Diagnosis not present

## 2020-09-19 DIAGNOSIS — F32A Depression, unspecified: Secondary | ICD-10-CM | POA: Diagnosis not present

## 2020-09-19 MED ORDER — BUSPIRONE HCL 5 MG PO TABS: 5.0000 mg | ORAL_TABLET | Freq: Three times a day (TID) | ORAL | 0 refills | Status: DC

## 2020-09-19 NOTE — Telephone Encounter (Signed)
Called to schedule 2 week mychart f/u no answer left vm

## 2020-09-19 NOTE — Assessment & Plan Note (Signed)
Chronic.  Ongoing.  Will continue with the current dose of Zoloft due to patient having side effects until 2 days ago.  Will give Buspar to use PRN for anxiety. Discussed increasing Zoloft at next visit if depression and anxiety are still not well controlled.

## 2020-09-24 ENCOUNTER — Telehealth: Payer: Self-pay

## 2020-09-24 NOTE — Telephone Encounter (Signed)
lvm to make this Return in about 2 weeks (around 10/03/2020) for Depression/Anxiety FU (virtual). Apt per pcp

## 2020-09-24 NOTE — Telephone Encounter (Signed)
-----   Message from Jon Billings, NP sent at 09/19/2020  8:28 AM EDT ----- Can we get her follow up scheduled?

## 2020-09-27 ENCOUNTER — Ambulatory Visit: Payer: 59 | Admitting: Obstetrics and Gynecology

## 2020-10-03 ENCOUNTER — Ambulatory Visit (INDEPENDENT_AMBULATORY_CARE_PROVIDER_SITE_OTHER): Payer: 59 | Admitting: Obstetrics and Gynecology

## 2020-10-03 ENCOUNTER — Other Ambulatory Visit: Payer: Self-pay

## 2020-10-03 VITALS — Wt 234.0 lb

## 2020-10-03 DIAGNOSIS — Z6836 Body mass index (BMI) 36.0-36.9, adult: Secondary | ICD-10-CM | POA: Diagnosis not present

## 2020-10-03 DIAGNOSIS — E282 Polycystic ovarian syndrome: Secondary | ICD-10-CM | POA: Diagnosis not present

## 2020-10-03 DIAGNOSIS — E8881 Metabolic syndrome: Secondary | ICD-10-CM | POA: Diagnosis not present

## 2020-10-03 MED ORDER — INSULIN PEN NEEDLE 32G X 6 MM MISC: 1.0000 [IU] | 3 refills | Status: DC

## 2020-10-03 MED ORDER — OZEMPIC (0.25 OR 0.5 MG/DOSE) 2 MG/1.5ML ~~LOC~~ SOPN: PEN_INJECTOR | SUBCUTANEOUS | 0 refills | Status: DC

## 2020-10-03 MED ORDER — PHENTERMINE HCL 37.5 MG PO TABS: 37.5000 mg | ORAL_TABLET | Freq: Every day | ORAL | 0 refills | Status: DC

## 2020-10-03 NOTE — Telephone Encounter (Signed)
Lvm to make this apt Return in about 2 weeks (around 10/03/2020) for Depression/Anxiety FU (virtual). Apt per pcp

## 2020-10-03 NOTE — Progress Notes (Signed)
I connected with Donna Cox on 10/03/20 at 10:10 AM EDT by telephone and verified that I am speaking with the correct person using two identifiers.   I discussed the limitations, risks, security and privacy concerns of performing an evaluation and management service by telephone and the availability of in person appointments. I also discussed with the patient that there may be a patient responsible charge related to this service. The patient expressed understanding and agreed to proceed.  The patient was at home I spoke with the patient from my workstation phone The names of people involved in this encounter were: Donna Cox , and [name]   Gynecology Office Visit  Chief Complaint: No chief complaint on file.   History of Present Illness: Patientis a 30 y.o. G33P1021 female, who presents for the evaluation of the desire to lose weight. She has lost 3 pounds 1 months. The patient states the following symptoms since starting her weight loss therapy: appetite suppression, energy, and weight loss.  The patient also reports no other ill effects. The patient specifically denies heart palpitations, anxiety, and insomnia. Appetite suppression less pronounced this month   Review of Systems: 10 point review of systems negative unless otherwise noted in HPI  Past Medical History:  Past Medical History:  Diagnosis Date  . Bipolar disorder ([place])   . Depression   . GERD (gastroesophageal reflux disease)   . History of kidney stones    H/O  . Hypothyroidism   . Pre-diabetes   . Thyroid disease   . Tobacco use     Past Surgical History:  Past Surgical History:  Procedure Laterality Date  . HYSTEROSCOPY WITH D & C N/A 03/15/2020   Procedure: DILATATION AND CURETTAGE /HYSTEROSCOPY;  Surgeon: [name], MD;  Location: [facility];  Service: Gynecology;  Laterality: N/A;  . MOUTH SURGERY      Gynecologic History: No LMP recorded. (Menstrual status: Irregular  Periods).  Obstetric History: [id number]  Family History:  Family History  Problem Relation Age of Onset  . Mental illness Mother   . Diabetes Mother   . Thyroid disease Mother   . Heart disease Maternal Grandmother   . Heart disease Maternal Grandfather   . Cancer Maternal Grandfather        lung    Social History:  Social History   Socioeconomic History  . Marital status: Married    Spouse name: Not on file  . Number of children: Not on file  . Years of education: Not on file  . Highest education level: Not on file  Occupational History    Employer: [employer]  Tobacco Use  . Smoking status: Former Smoker    Packs/day: 1.50    Years: 6.00    Pack years: 9.00    Types: Cigarettes    Quit date: 06/23/2014    Years since quitting: 6.2  . Smokeless tobacco: Never Used  Vaping Use  . Vaping Use: Former  . Substances: Flavoring  Substance and Sexual Activity  . Alcohol use: Yes    Comment: rare occasion  . Drug use: No  . Sexual activity: Yes    Birth control/protection: None  Other Topics Concern  . Not on file  Social History Narrative  . Not on file   Social Determinants of Health   Financial Resource Strain: Not on file  Food Insecurity: Not on file  Transportation Needs: Not on file  Physical Activity: Not on file  Stress: Not on file  Social  Connections: Not on file  Intimate Partner Violence: Not on file    Allergies:  No Known Allergies  Medications: Prior to Admission medications   Medication Sig Start Date End Date Taking? Authorizing Provider  aspirin-acetaminophen-caffeine (EXCEDRIN MIGRAINE) [phone] MG tablet Take 2 tablets by mouth every 6 (six) hours as needed for headache.    [provider]  busPIRone (BUSPAR) 5 MG tablet Take 1 tablet (5 mg total) by mouth 3 (three) times daily. 09/19/20   Jon Billings, NP  levothyroxine (SYNTHROID) 50 MCG tablet Take 1 tablet (50 mcg total) by mouth daily before breakfast. 03/08/20    [name], MD  phentermine (ADIPEX-P) 37.5 MG tablet Take 1 tablet (37.5 mg total) by mouth daily before breakfast. 09/06/20   [name], MD  Prenatal Vit-Fe Fumarate-FA (PRENATAL VITAMIN PO) Take 1 tablet by mouth daily.    [provider]  sertraline (ZOLOFT) 25 MG tablet Take 1 tablet (25 mg total) by mouth daily. 09/06/20   Jon Billings, NP    Physical Exam unknown if currently breastfeeding. Wt Readings from Last 3 Encounters:  08/30/20 237 lb 2 oz (107.6 kg)  03/29/20 237 lb (107.5 kg)  03/15/20 238 lb (108 kg)  Body mass index is 36.65 kg/m.  No physical exam as this was a remote telephone visit to promote social distancing during the current COVID-19 Pandemic   Assessment: 30 y.o. [id number] weight loss follow up  Plan: Problem List Items Addressed This Visit   None   Visit Diagnoses    Class 2 severe obesity with serious comorbidity and body mass index (BMI) of 36.0 to 36.9 in adult, unspecified obesity type ([place])    -  Primary   Relevant Medications   phentermine (ADIPEX-P) 37.5 MG tablet   Semaglutide,0.25 or 0.5MG /DOS, (OZEMPIC, 0.25 OR 0.5 MG/DOSE,) 2 MG/1.5ML SOPN   PCOS (polycystic ovarian syndrome)       Insulin resistance          1) 1500 Calorie ADA Diet  2) Patient education given regarding appropriate lifestyle changes for weight loss including: regular physical activity, healthy coping strategies, caloric restriction and healthy eating patterns.  3) Patient to take medication, with the benefits of appetite suppression and metabolism boost d/w pt, along with the side effects and risk factors of long term use that will be avoided with our use of short bursts of therapy. Rx provided.   - add Ozempic given insulin resistance and PCOS - If Ozempic not covered discussed addition of metformin - Discussed bariatric consul ation as an option as well  4) Telephone 11:42min  5)  Return in about 4 weeks (around 10/31/2020) for medication  follow up.    [name], MD, [facility], [facility] 10/03/2020, 10:39 AM

## 2020-10-09 NOTE — Telephone Encounter (Signed)
APT SCHEDULED

## 2020-10-10 NOTE — Progress Notes (Deleted)
There were no vitals taken for this visit.   Subjective:    Patient ID: Donna Cox, female    DOB: 07-01-1990, 30 y.o.   MRN: [id number]  HPI: Donna Cox is a 30 y.o. female  No chief complaint on file.  ANXIETY/STRESS Patient started the Zoloft and had worsening anxiety over the last two weeks.  Patient had a hard time sitting still and was only sleeping about 4 hours a night.  However, on Monday she noticed an improvement in her anxiety and depression.  She feels like she is getting used to the medication and states that the side effects are improving.  Duration:uncontrolled Anxious mood: yes  Excessive worrying: yes Irritability: yes  Sweating: no Nausea: no Palpitations:no Hyperventilation: no Panic attacks: no Agoraphobia: no  Obscessions/compulsions: no Depressed mood: yes Anhedonia: no Weight changes: yes Insomnia: no .  Hypersomnia: no Recent Stressors/Life Changes: moving, miscarriages.   Relationship problems: no   Family stress: yes     Financial stress: no    Job stress: no    Recent death/loss: no  GAD 7 : Generalized Anxiety Score 09/19/2020 09/06/2020 02/16/2020  Nervous, Anxious, on Edge 3 1 3   Control/stop worrying 3 2 3   Worry too much - different things 3 2 3   Trouble relaxing 3 1 3   Restless 0 1 1  Easily annoyed or irritable 0 2 3  Afraid - awful might happen 0 0 0  Total GAD 7 Score 12 9 16   Anxiety Difficulty Not difficult at all Somewhat difficult Very difficult   Flowsheet Row Video Visit from 09/19/2020 in [place]  PHQ-9 Total Score 15       Relevant past medical, surgical, family and social history reviewed and updated as indicated. Interim medical history since our last visit reviewed. Allergies and medications reviewed and updated.  Review of Systems  Psychiatric/Behavioral: Positive for dysphoric mood. Negative for suicidal ideas. The patient is not nervous/anxious.     Per HPI unless specifically indicated  above     Objective:    There were no vitals taken for this visit.  Wt Readings from Last 3 Encounters:  10/03/20 234 lb (106.1 kg)  08/30/20 237 lb 2 oz (107.6 kg)  03/29/20 237 lb (107.5 kg)    Physical Exam Vitals and nursing note reviewed.  HENT:     Head: Normocephalic.     Right Ear: Hearing normal.     Left Ear: Hearing normal.     Nose: Nose normal.  Eyes:     Pupils: Pupils are equal, round, and reactive to light.  Pulmonary:     Effort: Pulmonary effort is normal. No respiratory distress.  Neurological:     Mental Status: She is alert.  Psychiatric:        Mood and Affect: Mood normal.        Behavior: Behavior normal.        Thought Content: Thought content normal.        Judgment: Judgment normal.     Results for orders placed or performed in visit on 07/16/20  Progesterone  Result Value Ref Range   Progesterone 0.1 ng/mL      Assessment & Plan:   Problem List Items Addressed This Visit   None      Follow up plan: No follow-ups on file.   This visit was completed via MyChart due to the restrictions of the COVID-19 pandemic. All issues as above were discussed and addressed. Physical exam was  done as above through visual confirmation on MyChart. If it was felt that the patient should be evaluated in the office, they were directed there. The patient verbally consented to this visit. 1. Location of the patient: Home 2. Location of the provider: Office 3. Those involved with this call:  ? Provider: Jon Billings, NP ? CMA: Lowry Ram, CMA ? Front Desk/Registration: [facility] Side 4. Time spent on call: 20 minutes with patient face to face via video conference. More than 50% of this time was spent in counseling and coordination of care. 30 minutes total spent in review of patient's record and preparation of their chart.

## 2020-10-11 ENCOUNTER — Telehealth: Payer: 59 | Admitting: Nurse Practitioner

## 2020-10-11 ENCOUNTER — Other Ambulatory Visit: Payer: Self-pay

## 2020-10-11 ENCOUNTER — Telehealth: Payer: Self-pay

## 2020-10-11 DIAGNOSIS — F419 Anxiety disorder, unspecified: Secondary | ICD-10-CM

## 2020-10-11 NOTE — Telephone Encounter (Signed)
I can see her tomorrow at 840!

## 2020-10-11 NOTE — Telephone Encounter (Signed)
Copied from [place] [id number]. Topic: General - Other >> Oct 11, 2020  9:07 AM Lennox Solders wrote: Reason for CRM: Pt had an appt today at 8 am. Pt called at 9 am and missed mychart appt due to power outrage. I had pt appt on 10-15-2020 afternoon. Pt states must have a morning appt. I told patient if will be further out for morning appt. Pt said do not worry about it and d/c the call. Pt states she will be out of medication. Please advise

## 2020-10-11 NOTE — Progress Notes (Signed)
There were no vitals taken for this visit.   Subjective:    Patient ID: Donna Cox, female    DOB: [birth year], 30 y.o.   MRN: [id number]  HPI: Donna Cox is a 30 y.o. female  Chief Complaint  Patient presents with  . Anxiety  . Depression    Pt states sertraline is helping some   . Sore Throat    Children had strep throat, patient believes she has now    ANXIETY/STRESS Patient states her anxiety is causing her social anxiety. She was at a family function over the weekend and she had to leave after about 30 mins because she couldn't stand to be there any longer. Patient was speaking with her sister who had the same symptoms with being on Zoloft.  Patient would like to try Effexor. Patient states she was having more anxiety towards the end of the day and that is when her family functions are.  Patient states she takes the mediation in the morning. Feels like her mood  Duration:uncontrolled Anxious mood: yes  Excessive worrying: yes Irritability: yes  Sweating: no Nausea: no Palpitations:no Hyperventilation: no Panic attacks: no Agoraphobia: no  Obscessions/compulsions: no Depressed mood: yes Anhedonia: no Weight changes: yes Insomnia: no .  Hypersomnia: no Recent Stressors/Life Changes: moving, miscarriages.   Relationship problems: no   Family stress: yes     Financial stress: no    Job stress: no    Recent death/loss: no  GAD 7 : Generalized Anxiety Score 10/12/2020 09/19/2020 09/06/2020 02/16/2020  Nervous, Anxious, on Edge 2 3 1 3   Control/stop worrying 1 3 2 3   Worry too much - different things 0 3 2 3   Trouble relaxing 0 3 1 3   Restless 0 0 1 1  Easily annoyed or irritable 1 0 2 3  Afraid - awful might happen 1 0 0 0  Total GAD 7 Score 5 12 9 16   Anxiety Difficulty Not difficult at all Not difficult at all Somewhat difficult Very difficult   Flowsheet Row Video Visit from 10/12/2020 in [place] [facility]  PHQ-9 Total Score 6       Relevant past  medical, surgical, family and social history reviewed and updated as indicated. Interim medical history since our last visit reviewed. Allergies and medications reviewed and updated.  Review of Systems  Psychiatric/Behavioral: Positive for dysphoric mood. Negative for suicidal ideas. The patient is not nervous/anxious.     Per HPI unless specifically indicated above     Objective:    There were no vitals taken for this visit.  Wt Readings from Last 3 Encounters:  10/03/20 234 lb (106.1 kg)  08/30/20 237 lb 2 oz (107.6 kg)  03/29/20 237 lb (107.5 kg)    Physical Exam Vitals and nursing note reviewed.  HENT:     Head: Normocephalic.     Right Ear: Hearing normal.     Left Ear: Hearing normal.     Nose: Nose normal.  Eyes:     Pupils: Pupils are equal, round, and reactive to light.  Pulmonary:     Effort: Pulmonary effort is normal. No respiratory distress.  Neurological:     Mental Status: She is alert.  Psychiatric:        Mood and Affect: Mood normal.        Behavior: Behavior normal.        Thought Content: Thought content normal.        Judgment: Judgment normal.     Results  for orders placed or performed in visit on 07/16/20  Progesterone  Result Value Ref Range   Progesterone 0.1 ng/mL      Assessment & Plan:   Problem List Items Addressed This Visit      Other   Anxiety and depression - Primary    Chronic.  Improving. Patient states she would like to switch to Effexor. Side effects and benefits of medication dicussed with patient during visit today.  Follow up in 1 month for reevaluation.       Relevant Medications   venlafaxine XR (EFFEXOR XR) 37.5 MG 24 hr capsule       Follow up plan: Return in about 1 month (around 11/11/2020) for Depression/Anxiety FU (virtual).   This visit was completed via MyChart due to the restrictions of the COVID-19 pandemic. All issues as above were discussed and addressed. Physical exam was done as above through visual  confirmation on MyChart. If it was felt that the patient should be evaluated in the office, they were directed there. The patient verbally consented to this visit. 1. Location of the patient: Home 2. Location of the provider: Office 3. Those involved with this call:  ? Provider: Jon Billings, NP ? CMA: Lowry Ram, CMA ? Front Desk/Registration: [facility] 4. Time spent on call: 20 minutes with patient face to face via video conference. More than 50% of this time was spent in counseling and coordination of care. 30 minutes total spent in review of patient's record and preparation of their chart.

## 2020-10-11 NOTE — Telephone Encounter (Signed)
Mycharted pt to see if she can do tomorrow

## 2020-10-12 ENCOUNTER — Encounter: Payer: Self-pay | Admitting: Nurse Practitioner

## 2020-10-12 ENCOUNTER — Telehealth (INDEPENDENT_AMBULATORY_CARE_PROVIDER_SITE_OTHER): Payer: 59 | Admitting: Nurse Practitioner

## 2020-10-12 DIAGNOSIS — F32A Depression, unspecified: Secondary | ICD-10-CM | POA: Diagnosis not present

## 2020-10-12 DIAGNOSIS — F419 Anxiety disorder, unspecified: Secondary | ICD-10-CM

## 2020-10-12 MED ORDER — VENLAFAXINE HCL ER 37.5 MG PO CP24: 37.5000 mg | ORAL_CAPSULE | Freq: Every day | ORAL | 0 refills | Status: DC

## 2020-10-12 NOTE — Assessment & Plan Note (Signed)
Chronic.  Improving. Patient states she would like to switch to Effexor. Side effects and benefits of medication dicussed with patient during visit today.  Follow up in 1 month for reevaluation.

## 2020-10-15 ENCOUNTER — Telehealth: Payer: 59 | Admitting: Nurse Practitioner

## 2020-10-15 ENCOUNTER — Telehealth: Payer: Self-pay

## 2020-10-15 NOTE — Telephone Encounter (Signed)
1 month f.u can be virtual

## 2020-10-15 NOTE — Telephone Encounter (Signed)
-----   Message from Jon Billings, NP sent at 10/12/2020  9:31 AM EDT ----- Can we schedule her follow up, please?!

## 2020-10-16 NOTE — Telephone Encounter (Signed)
Lvm to make this apt. 

## 2020-10-17 NOTE — Telephone Encounter (Signed)
Apt has been scheduled.

## 2020-11-08 NOTE — Progress Notes (Signed)
There were no vitals taken for this visit.   Subjective:    Patient ID: Donna Cox, female    DOB: September 19, 1990, 30 y.o.   MRN: [id number]  HPI: Shabrea Weldin is a 30 y.o. female  Chief Complaint  Patient presents with  . Anxiety   ANXIETY/STRESS Patient states she feels a lot better.  However, she is still having social anxiety. She feels like social events are very overwhelming.  She has signed up to help her with her daughter's softball team to help get her out of the house.  This has caused increased anxiety.  Patient has not been able to get into therapist yet.   GAD 7 : Generalized Anxiety Score 11/09/2020 10/12/2020 09/19/2020 09/06/2020  Nervous, Anxious, on Edge 1 2 3 1   Control/stop worrying 0 1 3 2   Worry too much - different things 1 0 3 2  Trouble relaxing 0 0 3 1  Restless 0 0 0 1  Easily annoyed or irritable 1 1 0 2  Afraid - awful might happen 0 1 0 0  Total GAD 7 Score 3 5 12 9   Anxiety Difficulty Not difficult at all Not difficult at all Not difficult at all Somewhat difficult    [facility] Office Visit from 11/09/2020 in [facility]  PHQ-9 Total Score 3     Duration Improving Anxious mood: yes in social situations Excessive worrying: yes Irritability: yes  Sweating: no Nausea: no Palpitations:no Hyperventilation: no Panic attacks: no Agoraphobia: no  Obscessions/compulsions: no Depressed mood: yes Anhedonia: no Weight changes: yes Insomnia: no .  Hypersomnia: no Recent Stressors/Life Changes: moving, miscarriages.   Relationship problems: no   Family stress: yes     Financial stress: no    Job stress: no    Recent death/loss: no  ABNORMAL VAGINAL BLEEDING Patient has been working with GYN to figure out why she is having abnormal vaginal bleeding.  Patient states she has been on her period for 45 days straight.  Wondering if he thyroid medication is off.  Will be following up with GYN.   Relevant past medical, surgical, family and  social history reviewed and updated as indicated. Interim medical history since our last visit reviewed. Allergies and medications reviewed and updated.  Review of Systems  Genitourinary: Positive for vaginal bleeding.  Psychiatric/Behavioral: Positive for dysphoric mood. Negative for suicidal ideas. The patient is not nervous/anxious.     Per HPI unless specifically indicated above     Objective:    There were no vitals taken for this visit.  Wt Readings from Last 3 Encounters:  10/03/20 234 lb (106.1 kg)  08/30/20 237 lb 2 oz (107.6 kg)  03/29/20 237 lb (107.5 kg)    Physical Exam Vitals and nursing note reviewed.  HENT:     Head: Normocephalic.     Right Ear: Hearing normal.     Left Ear: Hearing normal.     Nose: Nose normal.  Eyes:     Pupils: Pupils are equal, round, and reactive to light.  Pulmonary:     Effort: Pulmonary effort is normal. No respiratory distress.  Neurological:     Mental Status: She is alert.  Psychiatric:        Mood and Affect: Mood normal.        Behavior: Behavior normal.        Thought Content: Thought content normal.        Judgment: Judgment normal.     Results for orders  placed or performed in visit on 07/16/20  Progesterone  Result Value Ref Range   Progesterone 0.1 ng/mL      Assessment & Plan:   Problem List Items Addressed This Visit      Endocrine   Hypothyroid - Primary    Chronic.  Patient has not had labs done since September which were abnormal.  Labs ordered today to evaluate.  Will make recommendations based on lab results.       Relevant Orders   TSH   T4, free     Other   Anxiety and depression    Chronic.  Improving.  Will increase dose of Effexor.  Continues to have social anxiety.  Will add Buspar PRN for social anxiety. Can take TID if needed. Reviewed side effects and benefits of medication with patient during visit.  Recommend therapy to help with social anxiety.  Follow up in 1 month.      Relevant  Medications   venlafaxine XR (EFFEXOR XR) 75 MG 24 hr capsule   busPIRone (BUSPAR) 10 MG tablet    Other Visit Diagnoses    Abnormal uterine bleeding       Labs ordered today to evaluate for anemia due to ongoing period.  Thyroid labs ordered. Will make recommendations based on lab reuslts. Follow up with GYN.   Relevant Orders   Anemia Profile B       Follow up plan: Return in about 1 month (around 12/10/2020) for Depression/Anxiety FU (virtual).   This visit was completed via MyChart due to the restrictions of the COVID-19 pandemic. All issues as above were discussed and addressed. Physical exam was done as above through visual confirmation on MyChart. If it was felt that the patient should be evaluated in the office, they were directed there. The patient verbally consented to this visit. 1. Location of the patient: Home 2. Location of the provider: Office 3. Those involved with this call:  ? Provider: Jon Billings, NP ? CMA: Lowry Ram, CMA ? Front Desk/Registration: [facility] 4. Time spent on call: 20 minutes with patient face to face via video conference. More than 50% of this time was spent in counseling and coordination of care. 30 minutes total spent in review of patient's record and preparation of their chart.

## 2020-11-09 ENCOUNTER — Ambulatory Visit (INDEPENDENT_AMBULATORY_CARE_PROVIDER_SITE_OTHER): Payer: 59 | Admitting: Nurse Practitioner

## 2020-11-09 ENCOUNTER — Encounter: Payer: Self-pay | Admitting: Nurse Practitioner

## 2020-11-09 ENCOUNTER — Telehealth: Payer: Self-pay

## 2020-11-09 DIAGNOSIS — E039 Hypothyroidism, unspecified: Secondary | ICD-10-CM | POA: Diagnosis not present

## 2020-11-09 DIAGNOSIS — F419 Anxiety disorder, unspecified: Secondary | ICD-10-CM | POA: Diagnosis not present

## 2020-11-09 DIAGNOSIS — F32A Depression, unspecified: Secondary | ICD-10-CM | POA: Diagnosis not present

## 2020-11-09 DIAGNOSIS — N939 Abnormal uterine and vaginal bleeding, unspecified: Secondary | ICD-10-CM

## 2020-11-09 MED ORDER — BUSPIRONE HCL 10 MG PO TABS: 5.0000 mg | ORAL_TABLET | Freq: Three times a day (TID) | ORAL | 1 refills | Status: DC

## 2020-11-09 MED ORDER — VENLAFAXINE HCL ER 75 MG PO CP24: 75.0000 mg | ORAL_CAPSULE | Freq: Every day | ORAL | 1 refills | Status: DC

## 2020-11-09 NOTE — Telephone Encounter (Signed)
Pt calling; would like a call back from [facility].  [place].

## 2020-11-09 NOTE — Assessment & Plan Note (Signed)
Chronic.  Patient has not had labs done since September which were abnormal.  Labs ordered today to evaluate.  Will make recommendations based on lab results.

## 2020-11-09 NOTE — Telephone Encounter (Signed)
Pt returned call; I couldn't take it; called pt back; left second msg to call me.

## 2020-11-09 NOTE — Assessment & Plan Note (Signed)
Chronic.  Improving.  Will increase dose of Effexor.  Continues to have social anxiety.  Will add Buspar PRN for social anxiety. Can take TID if needed. Reviewed side effects and benefits of medication with patient during visit.  Recommend therapy to help with social anxiety.  Follow up in 1 month.

## 2020-11-14 ENCOUNTER — Other Ambulatory Visit: Payer: Self-pay

## 2020-11-14 ENCOUNTER — Encounter: Payer: Self-pay | Admitting: Obstetrics and Gynecology

## 2020-11-14 ENCOUNTER — Ambulatory Visit (INDEPENDENT_AMBULATORY_CARE_PROVIDER_SITE_OTHER): Payer: 59 | Admitting: Obstetrics and Gynecology

## 2020-11-14 VITALS — BP 142/80 | Ht 68.0 in | Wt 225.0 lb

## 2020-11-14 DIAGNOSIS — Z6834 Body mass index (BMI) 34.0-34.9, adult: Secondary | ICD-10-CM | POA: Diagnosis not present

## 2020-11-14 DIAGNOSIS — E669 Obesity, unspecified: Secondary | ICD-10-CM

## 2020-11-14 DIAGNOSIS — N939 Abnormal uterine and vaginal bleeding, unspecified: Secondary | ICD-10-CM

## 2020-11-14 MED ORDER — PHENTERMINE HCL 37.5 MG PO TABS: 37.5000 mg | ORAL_TABLET | Freq: Every day | ORAL | 0 refills | Status: DC

## 2020-11-14 NOTE — Progress Notes (Signed)
Obstetrics & Gynecology Office Visit   Chief Complaint:  Chief Complaint  Patient presents with  . Gynecologic Exam    Discuss hysterectomy. RM 4    History of Present Illness: 30 y.o. [id number] with a long standing history of infertility. She has had an extensive work up both with myself as well [facility] end[facility].  Most recently she began experiencing heavy AUB. Ultrasound 03/02/2020 showed thickened endometrial stripe at 6.82mm.  Subsequent endometrial biopsy showed benign proliferative endometrium 03/15/2020.  Pap smear is up to date 03/02/2020 NILM and HPV negative.  We have tried OCP's as well as a provera taper with continued bleeding.  She and her husband have recently successfully completed an adoption and would like to proceed with hysterectomy for management of her AUB.   Review of Systems: Review of Systems  Constitutional: Negative.   Gastrointestinal: Negative.   Neurological: Negative for headaches.  Psychiatric/Behavioral: Negative.      Past Medical History:  Past Medical History:  Diagnosis Date  . Bipolar disorder ([place])   . Depression   . GERD (gastroesophageal reflux disease)   . History of kidney stones    H/O  . Hypothyroidism   . Pre-diabetes   . Thyroid disease   . Tobacco use     Past Surgical History:  Past Surgical History:  Procedure Laterality Date  . HYSTEROSCOPY WITH D & C N/A 03/15/2020   Procedure: DILATATION AND CURETTAGE /HYSTEROSCOPY;  Surgeon: [name], MD;  Location: [facility] ORS;  Service: Gynecology;  Laterality: N/A;  . MOUTH SURGERY      Gynecologic History: Patient's last menstrual period was 11/14/2020.  Obstetric History: [id number]  Family History:  Family History  Problem Relation Age of Onset  . Mental illness Mother   . Diabetes Mother   . Thyroid disease Mother   . Heart disease Maternal Grandmother   . Heart disease Maternal Grandfather   . Cancer Maternal Grandfather        lung    Social  History:  Social History   Socioeconomic History  . Marital status: Married    Spouse name: Not on file  . Number of children: Not on file  . Years of education: Not on file  . Highest education level: Not on file  Occupational History    Employer: [employer]  Tobacco Use  . Smoking status: Former Smoker    Packs/day: 1.50    Years: 6.00    Pack years: 9.00    Types: Cigarettes    Quit date: 06/23/2014    Years since quitting: 6.4  . Smokeless tobacco: Never Used  Vaping Use  . Vaping Use: Former  . Substances: Flavoring  Substance and Sexual Activity  . Alcohol use: Yes    Comment: rare occasion  . Drug use: No  . Sexual activity: Yes    Birth control/protection: None  Other Topics Concern  . Not on file  Social History Narrative  . Not on file   Social Determinants of Health   Financial Resource Strain: Not on file  Food Insecurity: Not on file  Transportation Needs: Not on file  Physical Activity: Not on file  Stress: Not on file  Social Connections: Not on file  Intimate Partner Violence: Not on file    Allergies:  No Known Allergies  Medications: Prior to Admission medications   Medication Sig Start Date End Date Taking? Authorizing Provider  levothyroxine (SYNTHROID) 50 MCG tablet Take 1 tablet (50 mcg total) by  mouth daily before breakfast. 03/08/20  Yes [name], MD  metFORMIN (GLUCOPHAGE) 500 MG tablet Take 500 mg by mouth daily with breakfast.   Yes [provider]  phentermine (ADIPEX-P) 37.5 MG tablet Take 1 tablet (37.5 mg total) by mouth daily before breakfast. 10/03/20  Yes [name], MD  venlafaxine XR (EFFEXOR XR) 75 MG 24 hr capsule Take 1 capsule (75 mg total) by mouth daily with breakfast. 11/09/20  Yes Jon Billings, NP  busPIRone (BUSPAR) 10 MG tablet Take 0.5 tablets (5 mg total) by mouth 3 (three) times daily. Patient not taking: Reported on 11/14/2020 11/09/20   Jon Billings, NP    Physical  Exam Vitals:  Vitals:   11/14/20 0920  BP: (!) 142/80   Patient's last menstrual period was 11/14/2020. Body mass index is 34.21 kg/m.  General: NAD HEENT: normocephalic, anicteric Pulmonary: No increased work of breathing Neurologic: Grossly intact Psychiatric: mood appropriate, affect full  Female chaperone present for pelvic  portions of the physical exam  Assessment: 30 y.o. [id number] presenting for discussion of surgical management option for her AUB  Plan: Problem List Items Addressed This Visit   None   Visit Diagnoses    Abnormal uterine bleeding (AUB)    -  Primary   Class 1 obesity without serious comorbidity with body mass index (BMI) of 34.0 to 34.9 in adult, unspecified obesity type       Relevant Medications   phentermine (ADIPEX-P) 37.5 MG tablet      1) AUB - failing medical management to date.  We discussed trial of Mirena IUD, hysteroscopy D&C, as well as novasure endometrial ablation as alternative management options to hysterectomy.    Patient opts for definitive surgical management via hysterectomy. The risks of surgery were discussed in detail with the patient including but not limited to: bleeding which may require transfusion or reoperation; infection which may require antibiotics; injury to bowel, bladder, ureters or other surrounding organs (With a literature reported rate of urinary tract injury of 1% quoted); need for additional procedures including laparotomy; thromboembolic phenomenon, incisional problems and other postoperative/anesthesia complications.  Patient was also advised that recovery procedure generally involves an overnight stay; and the  expected recovery time after a hysterectomy being in the range of 6-8 weeks.  Likelihood of success in alleviating the patient's symptoms was discussed.  While definitive in regards to issues with menstural bleeding, pelvic pain if present preoperatively may continue and in fact worsen postoperatively.  She is  aware that the procedure will render her unable to pursue childbearing in the future.   She was told that she will be contacted by our surgical scheduler regarding the time and date of her surgery; routine preoperative instructions of having nothing to eat or drink after midnight on the day prior to surgery and also coming to the hospital 1.5 hours prior to her time of surgery were also emphasized.  She was told she may be called for a preoperative appointment about a week prior to surgery and will be given further preoperative instructions at that visit.  Routine postoperative instructions will be reviewed with the patient and her family in detail after surgery. Printed patient education handouts about the procedure was given to the patient to review at home.  2) Weight loss - the patient is 10lbs down following her last month of phentermine,  Refill sent   [name], MD, [place], [place] 11/14/2020, 9:34 AM

## 2020-11-22 ENCOUNTER — Telehealth: Payer: Self-pay

## 2020-11-22 NOTE — Telephone Encounter (Signed)
-----   Message from [name], MD sent at 11/14/2020  6:08 PM EDT ----- Regarding: Surgery Surgery Booking Request Patient Full Name:  Donna Cox  MRN: [id number]  DOB: 1990-11-02  Surgeon: [name], MD  Requested Surgery Date and Time: 2-6 weeks Primary Diagnosis AND Code: N93.9 Secondary Diagnosis and Code:  Surgical Procedure: TLH, BS, cystoscopy RNFA Requested?: No L&D Notification: No Admission Status: surgery admit Length of Surgery: 100 min Special Case Needs: No H&P: Yes Phone Interview???:  No Interpreter: No Medical Clearance:  No Special Scheduling Instructions: No Any known health/anesthesia issues, diabetes, sleep apnea, latex allergy, defibrillator/pacemaker?: No Acuity: P2   (P1 highest, P2 delay may cause harm, P3 low, elective gyn, P4 lowest)

## 2020-11-22 NOTE — Telephone Encounter (Signed)
Left a message for the patient to return the call.  

## 2020-11-23 NOTE — Telephone Encounter (Signed)
-----   Message from [name], MD sent at 11/14/2020  6:08 PM EDT ----- Regarding: Surgery Surgery Booking Request Patient Full Name:  Hitomi Slape  MRN: [id number]  DOB: [birth year]  Surgeon: [name], MD  Requested Surgery Date and Time: 2-6 weeks Primary Diagnosis AND Code: N93.9 Secondary Diagnosis and Code:  Surgical Procedure: TLH, BS, cystoscopy RNFA Requested?: No L&D Notification: No Admission Status: surgery admit Length of Surgery: 100 min Special Case Needs: No H&P: Yes Phone Interview???:  No Interpreter: No Medical Clearance:  No Special Scheduling Instructions: No Any known health/anesthesia issues, diabetes, sleep apnea, latex allergy, defibrillator/pacemaker?: No Acuity: P2   (P1 highest, P2 delay may cause harm, P3 low, elective gyn, P4 lowest)

## 2020-11-23 NOTE — Telephone Encounter (Signed)
Called patient to schedule TLH/BS and cystoscopy w [facility]  DOS 6/16  H&P 6/7 @ 9:30   Covid testing  @ [facility], [address]. Advised pt to mask until DOS.  Pre-admit phone call appointment to be requested - date and time will be included on H&P paper work. Also all appointments will be updated on pt MyChart. Explained that this appointment has a call window. Based on the time scheduled will indicate if the call will be received within a 4 hour window before 1:00 or after.  Advised that pt may also receive calls from the hospital pharmacy and pre-service center.  Confirmed pt has [employer] as Chartered certified accountant. No secondary insurance.

## 2020-11-27 ENCOUNTER — Other Ambulatory Visit: Payer: Self-pay

## 2020-11-27 ENCOUNTER — Encounter: Payer: Self-pay | Admitting: Obstetrics and Gynecology

## 2020-11-27 ENCOUNTER — Ambulatory Visit (INDEPENDENT_AMBULATORY_CARE_PROVIDER_SITE_OTHER): Payer: 59 | Admitting: Obstetrics and Gynecology

## 2020-11-27 VITALS — BP 138/82 | Ht 68.0 in | Wt 225.0 lb

## 2020-11-27 DIAGNOSIS — N939 Abnormal uterine and vaginal bleeding, unspecified: Secondary | ICD-10-CM

## 2020-11-27 DIAGNOSIS — Z01818 Encounter for other preprocedural examination: Secondary | ICD-10-CM

## 2020-11-27 NOTE — Progress Notes (Signed)
Obstetrics & Gynecology Surgery H&P    Chief Complaint: Scheduled Surgery   History of Present Illness: Patient is a 30 y.o. [id number] presenting for scheduled TLH, BS, cystoscopy, for the treatment or further evaluation of abnormal .   Prior Treatments prior to proceeding with surgery include: hormonal suppression of menstruations  Preoperative Pap: 03/02/2020 Results: NILM  Preoperative Endometrial biopsy: 03/15/2020 Findings: normal proliferative endometrium Preoperative Ultrasound: 03/02/2020 Findings: normal   Review of Systems:10 point review of systems  Past Medical History:  Patient Active Problem List   Diagnosis Date Noted  . Anxiety and depression 09/19/2020  . Anxiety 02/16/2020  . Abnormal weight gain 10/10/2016  . Hypothyroid 10/10/2016  . History of vitamin D deficiency 10/10/2016  . Atypical nevi 01/23/2016    Past Surgical History:  Past Surgical History:  Procedure Laterality Date  . HYSTEROSCOPY WITH D & C N/A 03/15/2020   Procedure: DILATATION AND CURETTAGE /HYSTEROSCOPY;  Surgeon: [name], MD;  Location: [facility] ORS;  Service: Gynecology;  Laterality: N/A;  . MOUTH SURGERY      Family History:  Family History  Problem Relation Age of Onset  . Mental illness Mother   . Diabetes Mother   . Thyroid disease Mother   . Heart disease Maternal Grandmother   . Heart disease Maternal Grandfather   . Cancer Maternal Grandfather        lung    Social History:  Social History   Socioeconomic History  . Marital status: Married    Spouse name: Not on file  . Number of children: Not on file  . Years of education: Not on file  . Highest education level: Not on file  Occupational History    Employer: [employer]  Tobacco Use  . Smoking status: Former Smoker    Packs/day: 1.50    Years: 6.00    Pack years: 9.00    Types: Cigarettes    Quit date: 06/23/2014    Years since quitting: 6.4  . Smokeless tobacco: Never Used  Vaping Use  .  Vaping Use: Former  . Substances: Flavoring  Substance and Sexual Activity  . Alcohol use: Yes    Comment: rare occasion  . Drug use: No  . Sexual activity: Yes    Birth control/protection: None  Other Topics Concern  . Not on file  Social History Narrative  . Not on file   Social Determinants of Health   Financial Resource Strain: Not on file  Food Insecurity: Not on file  Transportation Needs: Not on file  Physical Activity: Not on file  Stress: Not on file  Social Connections: Not on file  Intimate Partner Violence: Not on file    Allergies:  No Known Allergies  Medications: Prior to Admission medications   Medication Sig Start Date End Date Taking? Authorizing Provider  levothyroxine (SYNTHROID) 50 MCG tablet Take 1 tablet (50 mcg total) by mouth daily before breakfast. 03/08/20  Yes [name], MD  phentermine (ADIPEX-P) 37.5 MG tablet Take 1 tablet (37.5 mg total) by mouth daily before breakfast. 11/14/20  Yes [name], MD  venlafaxine XR (EFFEXOR XR) 75 MG 24 hr capsule Take 1 capsule (75 mg total) by mouth daily with breakfast. 11/09/20  Yes Jon Billings, NP  acetaminophen (TYLENOL) 325 MG tablet Take 650 mg by mouth every 6 (six) hours as needed for moderate pain.    [provider]    Physical Exam Vitals: Blood pressure 138/82, height 5\' 8"  (1.727 m), weight 225 lb (  102.1 kg), last menstrual period 11/14/2020, unknown if currently breastfeeding.  General: NAD HEENT: normocephalic, anicteric Pulmonary: No increased work of breathing Cardiovascular: RRR, distal pulses 2+ Abdomen: soft, non-tender, non-distended Genitourinary: deferred Extremities: no edema, erythema, or tenderness Neurologic: Grossly intact Psychiatric: mood appropriate, affect full  Imaging No results found.  Assessment: 30 y.o. [id number] presenting for scheduled TLH, BS, cystoscopy  Plan: 1) Patient opts for definitive surgical management via hysterectomy. The  risks of surgery were discussed in detail with the patient including but not limited to: bleeding which may require transfusion or reoperation; infection which may require antibiotics; injury to bowel, bladder, ureters or other surrounding organs (With a literature reported rate of urinary tract injury of 1% quoted); need for additional procedures including laparotomy; thromboembolic phenomenon, incisional problems and other postoperative/anesthesia complications.  Patient was also advised that recovery procedure generally involves an overnight stay; and the  expected recovery time after a hysterectomy being in the range of 6-8 weeks.  Likelihood of success in alleviating the patient's symptoms was discussed.  While definitive in regards to issues with menstural bleeding, pelvic pain if present preoperatively may continue and in fact worsen postoperatively.  She is aware that the procedure will render her unable to pursue childbearing in the future.   She was told that she will be contacted by our surgical scheduler regarding the time and date of her surgery; routine preoperative instructions of having nothing to eat or drink after midnight on the day prior to surgery and also coming to the hospital 1.5 hours prior to her time of surgery were also emphasized.  She was told she may be called for a preoperative appointment about a week prior to surgery and will be given further preoperative instructions at that visit.  Routine postoperative instructions will be reviewed with the patient and her family in detail after surgery. Printed patient education handouts about the procedure was given to the patient to review at home.   2) Routine postoperative instructions were reviewed with the patient and her family in detail today including the expected length of recovery and likely postoperative course.  The patient concurred with the proposed plan, giving informed written consent for the surgery today.  Patient  instructed on the importance of being NPO after midnight prior to her procedure.  If warranted preoperative prophylactic antibiotics and SCDs ordered on call to the OR to meet SCIP guidelines and adhere to recommendation laid forth in [place] Number 104 May 2009  "Antibiotic Prophylaxis for Gynecologic Procedures".     [name], MD, [facility], [facility] 11/27/2020, 9:49 AM

## 2020-11-27 NOTE — Patient Instructions (Signed)
.  ams

## 2020-11-27 NOTE — H&P (View-Only) (Signed)
Obstetrics & Gynecology Surgery H&P    Chief Complaint: Scheduled Surgery   History of Present Illness: Patient is a 30 y.o. [id number] presenting for scheduled TLH, BS, cystoscopy, for the treatment or further evaluation of abnormal .   Prior Treatments prior to proceeding with surgery include: hormonal suppression of menstruations  Preoperative Pap: 03/02/2020 Results: NILM  Preoperative Endometrial biopsy: 03/15/2020 Findings: normal proliferative endometrium Preoperative Ultrasound: 03/02/2020 Findings: normal   Review of Systems:10 point review of systems  Past Medical History:  Patient Active Problem List   Diagnosis Date Noted  . Anxiety and depression 09/19/2020  . Anxiety 02/16/2020  . Abnormal weight gain 10/10/2016  . Hypothyroid 10/10/2016  . History of vitamin D deficiency 10/10/2016  . Atypical nevi 01/23/2016    Past Surgical History:  Past Surgical History:  Procedure Laterality Date  . HYSTEROSCOPY WITH D & C N/A 03/15/2020   Procedure: DILATATION AND CURETTAGE /HYSTEROSCOPY;  Surgeon: [name], MD;  Location: [facility] ORS;  Service: Gynecology;  Laterality: N/A;  . MOUTH SURGERY      Family History:  Family History  Problem Relation Age of Onset  . Mental illness Mother   . Diabetes Mother   . Thyroid disease Mother   . Heart disease Maternal Grandmother   . Heart disease Maternal Grandfather   . Cancer Maternal Grandfather        lung    Social History:  Social History   Socioeconomic History  . Marital status: Married    Spouse name: Not on file  . Number of children: Not on file  . Years of education: Not on file  . Highest education level: Not on file  Occupational History    Employer: [employer]  Tobacco Use  . Smoking status: Former Smoker    Packs/day: 1.50    Years: 6.00    Pack years: 9.00    Types: Cigarettes    Quit date: 06/23/2014    Years since quitting: 6.4  . Smokeless tobacco: Never Used  Vaping Use  .  Vaping Use: Former  . Substances: Flavoring  Substance and Sexual Activity  . Alcohol use: Yes    Comment: rare occasion  . Drug use: No  . Sexual activity: Yes    Birth control/protection: None  Other Topics Concern  . Not on file  Social History Narrative  . Not on file   Social Determinants of Health   Financial Resource Strain: Not on file  Food Insecurity: Not on file  Transportation Needs: Not on file  Physical Activity: Not on file  Stress: Not on file  Social Connections: Not on file  Intimate Partner Violence: Not on file    Allergies:  No Known Allergies  Medications: Prior to Admission medications   Medication Sig Start Date End Date Taking? Authorizing Provider  levothyroxine (SYNTHROID) 50 MCG tablet Take 1 tablet (50 mcg total) by mouth daily before breakfast. 03/08/20  Yes [name], MD  phentermine (ADIPEX-P) 37.5 MG tablet Take 1 tablet (37.5 mg total) by mouth daily before breakfast. 11/14/20  Yes [name], MD  venlafaxine XR (EFFEXOR XR) 75 MG 24 hr capsule Take 1 capsule (75 mg total) by mouth daily with breakfast. 11/09/20  Yes Jon Billings, NP  acetaminophen (TYLENOL) 325 MG tablet Take 650 mg by mouth every 6 (six) hours as needed for moderate pain.    [provider]    Physical Exam Vitals: Blood pressure 138/82, height 5\' 8"  (1.727 m), weight 225 lb (  102.1 kg), last menstrual period 11/14/2020, unknown if currently breastfeeding.  General: NAD HEENT: normocephalic, anicteric Pulmonary: No increased work of breathing Cardiovascular: RRR, distal pulses 2+ Abdomen: soft, non-tender, non-distended Genitourinary: deferred Extremities: no edema, erythema, or tenderness Neurologic: Grossly intact Psychiatric: mood appropriate, affect full  Imaging No results found.  Assessment: 30 y.o. [id number] presenting for scheduled TLH, BS, cystoscopy  Plan: 1) Patient opts for definitive surgical management via hysterectomy. The  risks of surgery were discussed in detail with the patient including but not limited to: bleeding which may require transfusion or reoperation; infection which may require antibiotics; injury to bowel, bladder, ureters or other surrounding organs (With a literature reported rate of urinary tract injury of 1% quoted); need for additional procedures including laparotomy; thromboembolic phenomenon, incisional problems and other postoperative/anesthesia complications.  Patient was also advised that recovery procedure generally involves an overnight stay; and the  expected recovery time after a hysterectomy being in the range of 6-8 weeks.  Likelihood of success in alleviating the patient's symptoms was discussed.  While definitive in regards to issues with menstural bleeding, pelvic pain if present preoperatively may continue and in fact worsen postoperatively.  She is aware that the procedure will render her unable to pursue childbearing in the future.   She was told that she will be contacted by our surgical scheduler regarding the time and date of her surgery; routine preoperative instructions of having nothing to eat or drink after midnight on the day prior to surgery and also coming to the hospital 1.5 hours prior to her time of surgery were also emphasized.  She was told she may be called for a preoperative appointment about a week prior to surgery and will be given further preoperative instructions at that visit.  Routine postoperative instructions will be reviewed with the patient and her family in detail after surgery. Printed patient education handouts about the procedure was given to the patient to review at home.   2) Routine postoperative instructions were reviewed with the patient and her family in detail today including the expected length of recovery and likely postoperative course.  The patient concurred with the proposed plan, giving informed written consent for the surgery today.  Patient  instructed on the importance of being NPO after midnight prior to her procedure.  If warranted preoperative prophylactic antibiotics and SCDs ordered on call to the OR to meet SCIP guidelines and adhere to recommendation laid forth in [place] Number 104 May 2009  "Antibiotic Prophylaxis for Gynecologic Procedures".     [name], MD, [facility], [facility] 11/27/2020, 9:49 AM

## 2020-11-29 ENCOUNTER — Telehealth: Payer: Self-pay

## 2020-11-29 ENCOUNTER — Other Ambulatory Visit: Payer: Self-pay

## 2020-11-29 ENCOUNTER — Other Ambulatory Visit
Admission: RE | Admit: 2020-11-29 | Discharge: 2020-11-29 | Disposition: A | Discharge status: Home / Self Care | Payer: 59 | Source: Ambulatory Visit | Attending: Obstetrics and Gynecology | Admitting: Obstetrics and Gynecology

## 2020-11-29 NOTE — Patient Instructions (Addendum)
Your procedure is scheduled on: December 06, 2020 THURSDAY Report to the Registration Desk on the 1st floor of the [facility]. To find out your arrival time, please call [phone] between 1PM - 3PM on: Wednesday December 05, 2020  REMEMBER: Instructions that are not followed completely may result in serious medical risk, up to and including death; or upon the discretion of your surgeon and anesthesiologist your surgery may need to be rescheduled.  DO NOT EAT OR DRINK after midnight the night before surgery.  No gum chewing, lozengers or hard candies.   TAKE THESE MEDICATIONS THE MORNING OF SURGERY WITH A SIP OF WATER: LEVOTHYROXINE EFFEXOR  One week prior to surgery: Stop Anti-inflammatories (NSAIDS) such as Advil, Aleve, Ibuprofen, Motrin, Naproxen, Naprosyn and ASPIRIN OR Aspirin based products such as Excedrin, Goodys Powder, BC Powder. Stop ANY OVER THE COUNTER supplements until after surgery. You may however, continue to take Tylenol if needed for pain up until the day of surgery.  No Alcohol for 24 hours before or after surgery.  No Smoking including e-cigarettes for 24 hours prior to surgery.  No chewable tobacco products for at least 6 hours prior to surgery.  No nicotine patches on the day of surgery.  Do not use any "recreational" drugs for at least a week prior to your surgery.  Please be advised that the combination of cocaine and anesthesia may have negative outcomes, up to and including death. If you test positive for cocaine, your surgery will be cancelled.  On the morning of surgery brush your teeth with toothpaste and water, you may rinse your mouth with mouthwash if you wish. Do not swallow any toothpaste or mouthwash.  Do not wear jewelry, make-up, hairpins, clips or nail polish.  Do not wear lotions, powders, or perfumes OR DEODORANT  Do not shave body from the neck down 48 hours prior to surgery just in case you cut yourself which could leave a site for  infection.  Also, freshly shaved skin may become irritated if using the CHG soap.  Contact lenses, hearing aids and dentures may not be worn into surgery.  Do not bring valuables to the hospital. [facility] is not responsible for any missing/lost belongings or valuables.   Use CHG Soap as directed on instruction sheet.  Notify your doctor if there is any change in your medical condition (cold, fever, infection).  Wear comfortable clothing (specific to your surgery type) to the hospital.  Plan for stool softeners for home use; pain medications have a tendency to cause constipation. You can also help prevent constipation by eating foods high in fiber such as fruits and vegetables and drinking plenty of fluids as your diet allows.  After surgery, you can help prevent lung complications by doing breathing exercises.  Take deep breaths and cough every 1-2 hours. Your doctor may order a device called an Incentive Spirometer to help you take deep breaths. When coughing or sneezing, hold a pillow firmly against your incision with both hands. This is called "splinting." Doing this helps protect your incision. It also decreases belly discomfort.  If you are being admitted to the hospital overnight. YOU MAY BRING A BAG WITH YOU.   If you are being discharged the day of surgery, you will not be allowed to drive home. You will need a responsible adult (18 years or older) to drive you home and stay with you that night.   If you are taking public transportation, you will need to have a responsible  adult (18 years or older) with you. Please confirm with your physician that it is acceptable to use public transportation.   Please call the [place] [facility]. at [phone] if you have any questions about these instructions.  Surgery Visitation Policy:  Patients undergoing a surgery or procedure may have one family member or support person with them as long as that person is not COVID-19  positive or experiencing its symptoms.  That person may remain in the waiting area during the procedure.  Inpatient Visitation:    Visiting hours are 7 a.m. to 8 p.m. Inpatients will be allowed two visitors daily. The visitors may change each day during the patient's stay. No visitors under the age of 22. Any visitor under the age of 26 must be accompanied by an adult. The visitor must pass COVID-19 screenings, use hand sanitizer when entering and exiting the patient's room and wear a mask at all times, including in the patient's room. Patients must also wear a mask when staff or their visitor are in the room. Masking is required regardless of vaccination status.

## 2020-11-29 NOTE — Telephone Encounter (Signed)
Donna Cox from [facility] [employer]; unable to understand what call is in reference to; Fax # [phone]; service reference # [id number].  Donna Cox's # [id number] ext [phone]  Left msg unable to understand msg; please call back on nurse line and spell the medication in question.

## 2020-11-29 NOTE — Telephone Encounter (Addendum)
FMLA/DISABILITY form for [employer] filled out for spouse, Armanda Heritage.Signature will be obtained. Paperwork will be faxed and processed.

## 2020-12-03 NOTE — Telephone Encounter (Signed)
[employer] FMLA Source for [employer]; [place] for pt faxed today.

## 2020-12-04 ENCOUNTER — Other Ambulatory Visit
Admission: RE | Admit: 2020-12-04 | Discharge: 2020-12-04 | Disposition: A | Discharge status: Home / Self Care | Payer: 59 | Source: Ambulatory Visit | Attending: Obstetrics and Gynecology | Admitting: Obstetrics and Gynecology

## 2020-12-04 ENCOUNTER — Other Ambulatory Visit: Payer: Self-pay

## 2020-12-04 DIAGNOSIS — Z01812 Encounter for preprocedural laboratory examination: Secondary | ICD-10-CM | POA: Insufficient documentation

## 2020-12-04 DIAGNOSIS — Z20822 Contact with and (suspected) exposure to covid-19: Secondary | ICD-10-CM | POA: Insufficient documentation

## 2020-12-04 LAB — TYPE AND SCREEN
ABO/RH(D): O POS
Antibody Screen: NEGATIVE

## 2020-12-04 LAB — BASIC METABOLIC PANEL
Anion gap: 7 (ref 5–15)
BUN: 14 mg/dL (ref 6–20)
CO2: 25 mmol/L (ref 22–32)
Calcium: 8.8 mg/dL — ABNORMAL LOW (ref 8.9–10.3)
Chloride: 105 mmol/L (ref 98–111)
Creatinine, Ser: 0.82 mg/dL (ref 0.44–1.00)
GFR, Estimated: >60 mL/min (ref >60)
Glucose, Bld: 112 mg/dL — ABNORMAL HIGH (ref 70–99)
Potassium: 3.7 mmol/L (ref 3.5–5.1)
Sodium: 137 mmol/L (ref 135–145)

## 2020-12-04 LAB — CBC
HCT: 31.8 % — ABNORMAL LOW (ref 36.0–46.0)
Hemoglobin: 10.7 g/dL — ABNORMAL LOW (ref 12.0–15.0)
MCH: 28.2 pg (ref 26.0–34.0)
MCHC: 33.6 g/dL (ref 30.0–36.0)
MCV: 83.7 fL (ref 80.0–100.0)
Platelets: 274 10*3/uL (ref 150–400)
RBC: 3.8 MIL/uL — ABNORMAL LOW (ref 3.87–5.11)
RDW: 13.2 % (ref 11.5–15.5)
WBC: 7.4 10*3/uL (ref 4.0–10.5)
nRBC: 0 % (ref 0.0–0.2)

## 2020-12-04 LAB — SARS CORONAVIRUS 2 (TAT 6-24 HRS): SARS Coronavirus 2: NEGATIVE

## 2020-12-04 NOTE — Telephone Encounter (Signed)
DOS 12/06/20 - [facility]/BS, Cystoscopy  This case was sent to me as "surgery admit". I submitted a auth request with [employer] and it was pended. I spoke with AMS regarding this and asked if "outpatient in bed" or "Ambulatory" could be an option. He agreed to either but asked that I also notify patient.  I contacted pt and explained the situation with her insurance. She understood. I explained that IF for whatever reason Dr Georgianne Fick felt that you needed to stay longer, the insurance would be contacted and the auth would be redone based on new diagnosis at the time. I then withdrew the "inpatient" auth request and created a new request for "ambulatory" surgery. It was approved. [id number]  I called to adv patient. She then brought up concern regarding [employer] paperwork. It was filled out with her being out of work effective 12/04/20 as she was being Covid tested and needed to quarantine/mask until DOS. Her test was done this morning. I changed the status of the auth from inpatient to outpatient after lunch today. With Outpatient surgery a Covid test/quarantine is not required. She is concerned about the paperwork dates. I adv that I would forward her concern to my boss to see if paperwork needs to be amended.   I also contacted the OR to let them know to change her case from inpatient to outpatient.

## 2020-12-05 NOTE — Telephone Encounter (Signed)
Whoever this was has not returned call.  Msg closed.

## 2020-12-06 ENCOUNTER — Encounter
Admission: RE | Disposition: A | Discharge status: Home / Self Care | Payer: Self-pay | Source: Home / Self Care | Attending: Obstetrics and Gynecology

## 2020-12-06 ENCOUNTER — Other Ambulatory Visit: Payer: Self-pay

## 2020-12-06 ENCOUNTER — Encounter: Payer: Self-pay | Admitting: Nurse Practitioner

## 2020-12-06 ENCOUNTER — Ambulatory Visit: Payer: 59 | Admitting: Anesthesiology

## 2020-12-06 ENCOUNTER — Telehealth (INDEPENDENT_AMBULATORY_CARE_PROVIDER_SITE_OTHER): Payer: 59 | Admitting: Nurse Practitioner

## 2020-12-06 ENCOUNTER — Encounter: Payer: Self-pay | Admitting: Obstetrics and Gynecology

## 2020-12-06 ENCOUNTER — Observation Stay
Admission: RE | Admit: 2020-12-06 | Discharge: 2020-12-07 | Disposition: A | Discharge status: Home / Self Care | Payer: 59 | Attending: Obstetrics and Gynecology | Admitting: Obstetrics and Gynecology

## 2020-12-06 DIAGNOSIS — F32A Depression, unspecified: Secondary | ICD-10-CM | POA: Diagnosis not present

## 2020-12-06 DIAGNOSIS — Z79899 Other long term (current) drug therapy: Secondary | ICD-10-CM | POA: Insufficient documentation

## 2020-12-06 DIAGNOSIS — Z87891 Personal history of nicotine dependence: Secondary | ICD-10-CM | POA: Insufficient documentation

## 2020-12-06 DIAGNOSIS — D62 Acute posthemorrhagic anemia: Secondary | ICD-10-CM | POA: Insufficient documentation

## 2020-12-06 DIAGNOSIS — N939 Abnormal uterine and vaginal bleeding, unspecified: Secondary | ICD-10-CM | POA: Diagnosis not present

## 2020-12-06 DIAGNOSIS — F419 Anxiety disorder, unspecified: Secondary | ICD-10-CM

## 2020-12-06 DIAGNOSIS — N840 Polyp of corpus uteri: Secondary | ICD-10-CM | POA: Diagnosis not present

## 2020-12-06 DIAGNOSIS — E039 Hypothyroidism, unspecified: Secondary | ICD-10-CM | POA: Diagnosis not present

## 2020-12-06 DIAGNOSIS — Z90710 Acquired absence of both cervix and uterus: Secondary | ICD-10-CM | POA: Diagnosis present

## 2020-12-06 HISTORY — PX: TOTAL LAPAROSCOPIC HYSTERECTOMY WITH SALPINGECTOMY: SHX6742

## 2020-12-06 HISTORY — PX: CYSTOSCOPY: SHX5120

## 2020-12-06 LAB — POCT PREGNANCY, URINE: Preg Test, Ur: NEGATIVE

## 2020-12-06 SURGERY — HYSTERECTOMY, TOTAL, LAPAROSCOPIC, WITH SALPINGECTOMY
Anesthesia: General

## 2020-12-06 MED ORDER — PROPOFOL 10 MG/ML IV BOLUS
INTRAVENOUS | Status: AC
  Filled 2020-12-06: qty 20

## 2020-12-06 MED ORDER — ACETAMINOPHEN 160 MG/5ML PO SOLN
325.0000 mg | ORAL | Status: DC | PRN
  Filled 2020-12-06: qty 20.3

## 2020-12-06 MED ORDER — PHENYLEPHRINE HCL (PRESSORS) 10 MG/ML IV SOLN
INTRAVENOUS | Status: DC | PRN
  Administered 2020-12-06: 100 ug via INTRAVENOUS
  Administered 2020-12-06: 200 ug via INTRAVENOUS
  Administered 2020-12-06: 100 ug via INTRAVENOUS
  Administered 2020-12-06: 50 ug via INTRAVENOUS

## 2020-12-06 MED ORDER — DEXTROSE-NACL 5-0.45 % IV SOLN: INTRAVENOUS | Status: DC

## 2020-12-06 MED ORDER — CEFAZOLIN SODIUM-DEXTROSE 2-4 GM/100ML-% IV SOLN
INTRAVENOUS | Status: AC
  Filled 2020-12-06: qty 100

## 2020-12-06 MED ORDER — ONDANSETRON HCL 4 MG/2ML IJ SOLN: 4.0000 mg | Freq: Four times a day (QID) | INTRAMUSCULAR | Status: DC | PRN

## 2020-12-06 MED ORDER — OXYCODONE-ACETAMINOPHEN 5-325 MG PO TABS
1.0000 | ORAL_TABLET | ORAL | Status: DC | PRN
  Administered 2020-12-06: 2 via ORAL

## 2020-12-06 MED ORDER — PROMETHAZINE HCL 25 MG/ML IJ SOLN: 6.2500 mg | INTRAMUSCULAR | Status: DC | PRN

## 2020-12-06 MED ORDER — FENTANYL CITRATE (PF) 100 MCG/2ML IJ SOLN
25.0000 ug | INTRAMUSCULAR | Status: DC | PRN
  Administered 2020-12-06: 50 ug via INTRAVENOUS
  Administered 2020-12-06 (×2): 25 ug via INTRAVENOUS

## 2020-12-06 MED ORDER — HYDROCODONE-ACETAMINOPHEN 7.5-325 MG PO TABS: 1.0000 | ORAL_TABLET | Freq: Once | ORAL | Status: DC | PRN

## 2020-12-06 MED ORDER — ROCURONIUM BROMIDE 100 MG/10ML IV SOLN
INTRAVENOUS | Status: DC | PRN
  Administered 2020-12-06: 50 mg via INTRAVENOUS
  Administered 2020-12-06: 20 mg via INTRAVENOUS

## 2020-12-06 MED ORDER — ORAL CARE MOUTH RINSE: 15.0000 mL | Freq: Once | OROMUCOSAL | Status: AC

## 2020-12-06 MED ORDER — KETOROLAC TROMETHAMINE 30 MG/ML IJ SOLN: 30.0000 mg | Freq: Once | INTRAMUSCULAR | Status: DC | PRN

## 2020-12-06 MED ORDER — OXYCODONE-ACETAMINOPHEN 5-325 MG PO TABS: 1.0000 | ORAL_TABLET | ORAL | 0 refills | Status: DC | PRN

## 2020-12-06 MED ORDER — AMMONIA AROMATIC IN INHA
RESPIRATORY_TRACT | Status: AC
  Filled 2020-12-06: qty 10

## 2020-12-06 MED ORDER — SIMETHICONE 80 MG PO CHEW
80.0000 mg | CHEWABLE_TABLET | Freq: Four times a day (QID) | ORAL | Status: DC | PRN
  Administered 2020-12-06: 80 mg via ORAL
  Filled 2020-12-06: qty 1

## 2020-12-06 MED ORDER — FENTANYL CITRATE (PF) 100 MCG/2ML IJ SOLN
25.0000 ug | INTRAMUSCULAR | Status: DC | PRN
  Administered 2020-12-06: 25 ug via INTRAVENOUS

## 2020-12-06 MED ORDER — OXYCODONE-ACETAMINOPHEN 5-325 MG PO TABS
ORAL_TABLET | ORAL | Status: AC
  Filled 2020-12-06: qty 2

## 2020-12-06 MED ORDER — FENTANYL CITRATE (PF) 100 MCG/2ML IJ SOLN
INTRAMUSCULAR | Status: AC
  Administered 2020-12-06: 50 ug via INTRAVENOUS
  Filled 2020-12-06: qty 2

## 2020-12-06 MED ORDER — ACETAMINOPHEN 10 MG/ML IV SOLN
INTRAVENOUS | Status: DC | PRN
  Administered 2020-12-06: 1000 mg via INTRAVENOUS

## 2020-12-06 MED ORDER — SEVOFLURANE IN SOLN
RESPIRATORY_TRACT | Status: AC
  Filled 2020-12-06: qty 250

## 2020-12-06 MED ORDER — HEMOSTATIC AGENTS (NO CHARGE) OPTIME
TOPICAL | Status: DC | PRN
  Administered 2020-12-06: 1 via TOPICAL

## 2020-12-06 MED ORDER — BUPIVACAINE HCL 0.5 % IJ SOLN
INTRAMUSCULAR | Status: DC | PRN
  Administered 2020-12-06: 15 mL

## 2020-12-06 MED ORDER — MENTHOL 3 MG MT LOZG
1.0000 | LOZENGE | OROMUCOSAL | Status: DC | PRN
  Filled 2020-12-06: qty 9

## 2020-12-06 MED ORDER — MORPHINE SULFATE (PF) 2 MG/ML IV SOLN
1.0000 mg | INTRAVENOUS | Status: DC | PRN
  Administered 2020-12-06: 2 mg via INTRAVENOUS
  Filled 2020-12-06: qty 1

## 2020-12-06 MED ORDER — ONDANSETRON HCL 4 MG PO TABS: 4.0000 mg | ORAL_TABLET | Freq: Four times a day (QID) | ORAL | Status: DC | PRN

## 2020-12-06 MED ORDER — FENTANYL CITRATE (PF) 100 MCG/2ML IJ SOLN
INTRAMUSCULAR | Status: AC
  Filled 2020-12-06: qty 2

## 2020-12-06 MED ORDER — HYDROMORPHONE HCL 2 MG PO TABS
4.0000 mg | ORAL_TABLET | ORAL | Status: DC | PRN
  Administered 2020-12-06 – 2020-12-07 (×2): 4 mg via ORAL
  Filled 2020-12-06 (×3): qty 2

## 2020-12-06 MED ORDER — POVIDONE-IODINE 10 % EX SWAB
2.0000 "application " | Freq: Once | CUTANEOUS | Status: AC
  Administered 2020-12-06: 2 via TOPICAL

## 2020-12-06 MED ORDER — BUPIVACAINE HCL (PF) 0.5 % IJ SOLN
INTRAMUSCULAR | Status: AC
  Filled 2020-12-06: qty 30

## 2020-12-06 MED ORDER — MORPHINE SULFATE (PF) 4 MG/ML IV SOLN
INTRAVENOUS | Status: AC
  Filled 2020-12-06: qty 1

## 2020-12-06 MED ORDER — FENTANYL CITRATE (PF) 100 MCG/2ML IJ SOLN
INTRAMUSCULAR | Status: AC
  Administered 2020-12-06: 25 ug via INTRAVENOUS
  Filled 2020-12-06: qty 2

## 2020-12-06 MED ORDER — ACETAMINOPHEN 325 MG PO TABS
325.0000 mg | ORAL_TABLET | ORAL | Status: DC | PRN
Start: 2020-12-06 — End: 2020-12-06

## 2020-12-06 MED ORDER — LACTATED RINGERS IV SOLN: INTRAVENOUS | Status: DC

## 2020-12-06 MED ORDER — CHLORHEXIDINE GLUCONATE 0.12 % MT SOLN
OROMUCOSAL | Status: AC
  Administered 2020-12-06: 15 mL via OROMUCOSAL
  Filled 2020-12-06: qty 15

## 2020-12-06 MED ORDER — ACETAMINOPHEN 10 MG/ML IV SOLN
INTRAVENOUS | Status: AC
  Filled 2020-12-06: qty 100

## 2020-12-06 MED ORDER — SUGAMMADEX SODIUM 200 MG/2ML IV SOLN
INTRAVENOUS | Status: DC | PRN
  Administered 2020-12-06: 200 mg via INTRAVENOUS

## 2020-12-06 MED ORDER — PROPOFOL 10 MG/ML IV BOLUS
INTRAVENOUS | Status: DC | PRN
  Administered 2020-12-06: 200 mg via INTRAVENOUS

## 2020-12-06 MED ORDER — ONDANSETRON HCL 4 MG/2ML IJ SOLN
INTRAMUSCULAR | Status: DC | PRN
  Administered 2020-12-06: 4 mg via INTRAVENOUS

## 2020-12-06 MED ORDER — LIDOCAINE HCL (CARDIAC) PF 100 MG/5ML IV SOSY
PREFILLED_SYRINGE | INTRAVENOUS | Status: DC | PRN
  Administered 2020-12-06: 100 mg via INTRAVENOUS

## 2020-12-06 MED ORDER — CHLORHEXIDINE GLUCONATE 0.12 % MT SOLN: 15.0000 mL | Freq: Once | OROMUCOSAL | Status: AC

## 2020-12-06 MED ORDER — LACTATED RINGERS IR SOLN
Status: DC | PRN
  Administered 2020-12-06: 400 mL

## 2020-12-06 MED ORDER — FAMOTIDINE 20 MG PO TABS
ORAL_TABLET | ORAL | Status: AC
  Administered 2020-12-06: 20 mg via ORAL
  Filled 2020-12-06: qty 1

## 2020-12-06 MED ORDER — DEXAMETHASONE SODIUM PHOSPHATE 10 MG/ML IJ SOLN
INTRAMUSCULAR | Status: DC | PRN
  Administered 2020-12-06: 10 mg via INTRAVENOUS

## 2020-12-06 MED ORDER — ONDANSETRON HCL 4 MG/2ML IJ SOLN
INTRAMUSCULAR | Status: AC
  Filled 2020-12-06: qty 2

## 2020-12-06 MED ORDER — FAMOTIDINE 20 MG PO TABS: 20.0000 mg | ORAL_TABLET | Freq: Once | ORAL | Status: AC

## 2020-12-06 MED ORDER — FENTANYL CITRATE (PF) 100 MCG/2ML IJ SOLN
INTRAMUSCULAR | Status: DC | PRN
  Administered 2020-12-06: 100 ug via INTRAVENOUS

## 2020-12-06 MED ORDER — CEFAZOLIN SODIUM-DEXTROSE 2-4 GM/100ML-% IV SOLN
2.0000 g | INTRAVENOUS | Status: AC
  Administered 2020-12-06: 2 g via INTRAVENOUS

## 2020-12-06 MED ORDER — DROPERIDOL 2.5 MG/ML IJ SOLN
0.6250 mg | Freq: Once | INTRAMUSCULAR | Status: DC | PRN
  Filled 2020-12-06: qty 2

## 2020-12-06 MED ORDER — IBUPROFEN 600 MG PO TABS: 600.0000 mg | ORAL_TABLET | Freq: Four times a day (QID) | ORAL | 0 refills | Status: DC | PRN

## 2020-12-06 MED ORDER — MIDAZOLAM HCL 2 MG/2ML IJ SOLN
INTRAMUSCULAR | Status: AC
  Filled 2020-12-06: qty 2

## 2020-12-06 MED ORDER — KETOROLAC TROMETHAMINE 30 MG/ML IJ SOLN
30.0000 mg | Freq: Four times a day (QID) | INTRAMUSCULAR | Status: DC
  Administered 2020-12-06 – 2020-12-07 (×3): 30 mg via INTRAVENOUS
  Filled 2020-12-06 (×3): qty 1

## 2020-12-06 MED ORDER — LACTATED RINGERS IV SOLN: INTRAVENOUS | Status: DC | PRN

## 2020-12-06 MED ORDER — IBUPROFEN 600 MG PO TABS: 600.0000 mg | ORAL_TABLET | Freq: Four times a day (QID) | ORAL | Status: DC

## 2020-12-06 MED ORDER — KETOROLAC TROMETHAMINE 30 MG/ML IJ SOLN
INTRAMUSCULAR | Status: DC | PRN
  Administered 2020-12-06: 30 mg via INTRAVENOUS

## 2020-12-06 SURGICAL SUPPLY — 57 items
APPLICATOR ARISTA FLEXITIP XL (MISCELLANEOUS) ×1 IMPLANT
BACTOSHIELD CHG 4% 4OZ (MISCELLANEOUS) ×1
BAG URINE DRAIN 2000ML AR STRL (UROLOGICAL SUPPLIES) ×3 IMPLANT
BLADE SURG SZ11 CARB STEEL (BLADE) ×3 IMPLANT
CANISTER SUCT 1200ML W/VALVE (MISCELLANEOUS) ×3 IMPLANT
CATH FOLEY 2WAY  5CC 16FR (CATHETERS) ×1
CATH URTH 16FR FL 2W BLN LF (CATHETERS) ×2 IMPLANT
CHLORAPREP W/TINT 26 (MISCELLANEOUS) ×3 IMPLANT
COVER WAND RF STERILE (DRAPES) ×3 IMPLANT
DEFOGGER SCOPE WARMER CLEARIFY (MISCELLANEOUS) ×3 IMPLANT
DERMABOND ADVANCED (GAUZE/BANDAGES/DRESSINGS) ×1
DERMABOND ADVANCED .7 DNX12 (GAUZE/BANDAGES/DRESSINGS) ×2 IMPLANT
DEVICE SUTURE ENDOST 10MM (ENDOMECHANICALS) ×3 IMPLANT
GAUZE 4X4 16PLY RFD (DISPOSABLE) ×3 IMPLANT
GLOVE SURG ENC MOIS LTX SZ7 (GLOVE) ×16 IMPLANT
GLOVE SURG UNDER LTX SZ7.5 (GLOVE) ×10 IMPLANT
GOWN STRL REUS W/ TWL LRG LVL3 (GOWN DISPOSABLE) ×4 IMPLANT
GOWN STRL REUS W/ TWL XL LVL3 (GOWN DISPOSABLE) ×2 IMPLANT
GOWN STRL REUS W/TWL LRG LVL3 (GOWN DISPOSABLE) ×2
GOWN STRL REUS W/TWL XL LVL3 (GOWN DISPOSABLE) ×1
GRASPER SUT TROCAR 14GX15 (MISCELLANEOUS) ×3 IMPLANT
HEMOSTAT ARISTA ABSORB 3G PWDR (HEMOSTASIS) ×1 IMPLANT
IRRIGATION STRYKERFLOW (MISCELLANEOUS) ×2 IMPLANT
IRRIGATOR STRYKERFLOW (MISCELLANEOUS) ×3
IV LACTATED RINGERS 1000ML (IV SOLUTION) ×3 IMPLANT
IV NS 1000ML (IV SOLUTION) ×1
IV NS 1000ML BAXH (IV SOLUTION) ×2 IMPLANT
KIT PINK PAD W/HEAD ARE REST (MISCELLANEOUS) ×3
KIT PINK PAD W/HEAD ARM REST (MISCELLANEOUS) ×2 IMPLANT
LABEL OR SOLS (LABEL) ×3 IMPLANT
MANIFOLD NEPTUNE II (INSTRUMENTS) ×3 IMPLANT
MANIPULATOR VCARE LG CRV RETR (MISCELLANEOUS) ×1 IMPLANT
MANIPULATOR VCARE SML CRV RETR (MISCELLANEOUS) IMPLANT
MANIPULATOR VCARE STD CRV RETR (MISCELLANEOUS) IMPLANT
NS IRRIG 1000ML POUR BTL (IV SOLUTION) ×3 IMPLANT
NS IRRIG 500ML POUR BTL (IV SOLUTION) ×3 IMPLANT
OCCLUDER COLPOPNEUMO (BALLOONS) IMPLANT
PACK GYN LAPAROSCOPIC (MISCELLANEOUS) ×3 IMPLANT
PAD OB MATERNITY 4.3X12.25 (PERSONAL CARE ITEMS) ×3 IMPLANT
PAD PREP 24X41 OB/GYN DISP (PERSONAL CARE ITEMS) ×3 IMPLANT
SCISSORS METZENBAUM CVD 33 (INSTRUMENTS) ×3 IMPLANT
SCRUB CHG 4% DYNA-HEX 4OZ (MISCELLANEOUS) ×2 IMPLANT
SET CYSTO W/LG BORE CLAMP LF (SET/KITS/TRAYS/PACK) ×3 IMPLANT
SHEARS HARMONIC ACE PLUS 36CM (ENDOMECHANICALS) ×3 IMPLANT
SLEEVE ENDOPATH XCEL 5M (ENDOMECHANICALS) ×3 IMPLANT
STRAP SAFETY 5IN WIDE (MISCELLANEOUS) ×3 IMPLANT
SURGILUBE 2OZ TUBE FLIPTOP (MISCELLANEOUS) ×3 IMPLANT
SUT ENDO VLOC 180-0-8IN (SUTURE) ×3 IMPLANT
SUT MNCRL 4-0 (SUTURE) ×2
SUT MNCRL 4-0 27XMFL (SUTURE) ×4
SUT VIC AB 0 CT1 36 (SUTURE) ×3 IMPLANT
SUTURE MNCRL 4-0 27XMF (SUTURE) ×4 IMPLANT
SYR 10ML LL (SYRINGE) ×3 IMPLANT
SYR 50ML LL SCALE MARK (SYRINGE) ×3 IMPLANT
TROCAR ENDO BLADELESS 11MM (ENDOMECHANICALS) ×3 IMPLANT
TROCAR XCEL NON-BLD 5MMX100MML (ENDOMECHANICALS) ×3 IMPLANT
TUBING EVAC SMOKE HEATED PNEUM (TUBING) ×3 IMPLANT

## 2020-12-06 NOTE — Op Note (Signed)
Preoperative Diagnosis: 1) 30 y.o.  with abnormal uterine bleeding 2) Acute blood loss anemia  Postoperative Diagnosis: 1) 30 y.o. with abnormal uterine bleeding 2) Acute blood loss anemia  Operation Performed: Total laparoscopic hysterectomy, bilateral salpingectomy, and cystoscopy  Indication: Abnormal uterine bleeding, acute blood loss anemia, failed medical management desiring definitive surgical management and no desire for future fertility  Surgeon: [name], MD  Assistant: Barnett Applebaum, MD this surgery required a high level surgical assistant with none other readily available  Anesthesia: General  Preoperative Antibiotics: 2g Ancef  Estimated Blood Loss: 100 mL  IV Fluids: 871mL  Urine Output:: 355mL  Drains or Tubes: none  Implants: none  Specimens Removed: Uterus, cervix, and bilateral fallopian tubes  Complications: none  Intraoperative Findings: normal uterus, tubes, and uterus  Patient Condition: stable  Procedure in Detail:  Patient was taken to the operating room where she was administered general anesthesia.  She was positioned in the dorsal lithotomy position utilizing Allen stirups, prepped and draped in the usual sterile fashion.  Prior to proceeding with procedure a time out was performed.  Attention was turned to the patient's pelvis.  An indwelling foley catheter was placed to decompress the patient's bladder.  An operative speculum was placed to allow visualization of the cervix.  The anterior lip of the cervix was grasped with a single tooth tenaculum, and a large V-care uterine manipulator was placed to allow manipulation of the uterus.  The operative speculum and single tooth tenaculum were then removed.  Attention was turned to the patient's abdomen.  The umbilicus was infiltrated with 1% Sensorcaine, before making a stab incision using an 11 blade scalpel.  A 73mm Excel trocar was then used to gain direct entry into the peritoneal cavity  utilizing the camera to visualize progress of the trocar during placement.  Once peritoneal entry had been achieved, insufflation was started and pneumoperitoneum established at a pressure of 90mmHg.    One left and one right lower quadrant site were then injected with 1% Sensorcaine and a stab incision was made using an 11 blade scalpel.  Two additional 47mm Excel trocars were placed through these incisions under direct visualization, the umbilical trocar was stepped up to an 33mm Excel trocar. General inspection of the abdomen revealed the above noted findings.   The right tube was identified and grasped at its fimbriated end.  The tube was transected from its attachments to the ovary and mesosalpinx using a 40mm Harmonic scalpel by Dr. Kenton Kingfisher.  The utero ovarian ligament was identified ligated and transected using the Harmonic scalpel. The round ligament was then likewise ligated and transected.  The anterior leaf of the broad ligament was dissected down to the level of the internal cervical os and a bladder flap was started.  The posterior leaf of the broad ligament was dissected down to the utero-sacral ligament.  The uterine artery was skeletonized before being ligated and transected using the Harmonic scalpel with cephelad pressure applied to the V-care device to assure lateralization of the ureter.  A bite was then taken with Harmonic medial to transected portio of uterine artery to further lateralize the ureter and vessel off the V-care cup.  The patient left adnexal structures were then dissected in similar fashion by myself.  The bladder flap was completed and the bladder mobilized off the V-care cup.  An anterior colpotomy was scores and carried around in a clockwise fashion to free the specimen, which was then removed vaginally.  Inspection revealed all  pedicles to be hemostatic before proceed with vaginal closure.  Arista was applied to all pedicles.  The 50m tocar site was closed with a Carter-Thompson  and 0 Vicryl suture. There was good closure of the fascia with no palpable defects.  Skin was closed with 0 Vicryl on the umbilical and left lower quadrant site.  The trocar sites were then closed with dermabond.  The indwelling foley catheter was removed.  Cystoscopy was performed noting and intact bladder dome as well as brisk efflux of urine from bother ureteral orifices.  The cystoscopy was removed and the indwelling foley catheter was not replaced. Sponge needle and instrument counts were correct time two.  The patient tolerated the procedure well and was taken to the recovery room in stable condition.

## 2020-12-06 NOTE — Anesthesia Preprocedure Evaluation (Addendum)
Anesthesia Evaluation  Patient identified by MRN, date of birth, ID band Patient awake    Reviewed: Allergy & Precautions, H&P , NPO status , reviewed documented beta blocker date and time   Airway Mallampati: IV  TM Distance: >3 FB Neck ROM: full    Dental  (+) Teeth Intact, Dental Advidsory Given   Pulmonary former smoker,    Pulmonary exam normal        Cardiovascular Normal cardiovascular exam     Neuro/Psych PSYCHIATRIC DISORDERS Anxiety Depression Bipolar Disorder    GI/Hepatic GERD  Controlled,  Endo/Other  Hypothyroidism   Renal/GU      Musculoskeletal   Abdominal   Peds  Hematology   Anesthesia Other Findings Past Medical History: No date: Bipolar disorder ([place]) No date: Depression No date: GERD (gastroesophageal reflux disease) No date: History of kidney stones     Comment:  H/O No date: Hypothyroidism No date: Pre-diabetes No date: Thyroid disease No date: Tobacco use Past Surgical History: 03/15/2020: HYSTEROSCOPY WITH D & C; N/A     Comment:  Procedure: DILATATION AND CURETTAGE /HYSTEROSCOPY;                Surgeon: [name], MD;  Location: [facility] ORS;                Service: Gynecology;  Laterality: N/A; No date: MOUTH SURGERY   Reproductive/Obstetrics                            Anesthesia Physical Anesthesia Plan  ASA: 2  Anesthesia Plan: General   Post-op Pain Management:    Induction: Intravenous  PONV Risk Score and Plan: Ondansetron and Treatment may vary due to age or medical condition  Airway Management Planned: Oral ETT  Additional Equipment:   Intra-op Plan:   Post-operative Plan: Extubation in OR  Informed Consent: I have reviewed the patients History and Physical, chart, labs and discussed the procedure including the risks, benefits and alternatives for the proposed anesthesia with the patient or authorized representative who has  indicated his/her understanding and acceptance.     Dental Advisory Given  Plan Discussed with: CRNA  Anesthesia Plan Comments:         Anesthesia Quick Evaluation

## 2020-12-06 NOTE — Progress Notes (Signed)
Called Dr. Georgianne Fick concerning the 9/10 pain after 200 mcg of Fentanyl given to patient. Patient crying in pain so Dr. Georgianne Fick stated he will come see the patient after his is done with his procedure.

## 2020-12-06 NOTE — Transfer of Care (Signed)
Immediate Anesthesia Transfer of Care Note  Patient: Ramiah Helfrich  Procedure(s) Performed: TOTAL LAPAROSCOPIC HYSTERECTOMY WITH SALPINGECTOMY (Bilateral) CYSTOSCOPY  Patient Location: PACU  Anesthesia Type:General  Level of Consciousness: awake, alert  and oriented  Airway & Oxygen Therapy: Patient Spontanous Breathing and Patient connected to face mask oxygen  Post-op Assessment: Report given to RN and Post -op Vital signs reviewed and stable  Post vital signs: Reviewed and stable  Last Vitals:  Vitals Value Taken Time  BP    Temp    Pulse 78 12/06/20 1235  Resp 18 12/06/20 1235  SpO2 100 % 12/06/20 1235  Vitals shown include unvalidated device data.  Last Pain:  Vitals:   12/06/20 0943  TempSrc: Temporal  PainSc: 0-No pain         Complications: No notable events documented.

## 2020-12-06 NOTE — Assessment & Plan Note (Signed)
Chronic.  Controlled.  Will continue current dose of Effexor. Follow up in 3 months.  Call sooner if concerns arise.  Patient Will come in for blood work once she has recovered from upcoming surgery.

## 2020-12-06 NOTE — Progress Notes (Signed)
Called because patient continues to report 9/10 pain despite 267mcg of Fentanyl in PACU.  At time of my evaluation the patient was sleeping.  Discussed with husband we can admit overnight to see achieve adequate pain control.  [name], MD, [facility], [facility] 12/06/2020, 2:58 PM

## 2020-12-06 NOTE — Interval H&P Note (Signed)
History and Physical Interval Note:  12/06/2020 9:48 AM  Donna Cox  has presented today for surgery, with the diagnosis of Abnormal uterine and vaginal bleeding N93.9.  The various methods of treatment have been discussed with the patient and family. After consideration of risks, benefits and other options for treatment, the patient has consented to  Procedure(s): TOTAL LAPAROSCOPIC HYSTERECTOMY WITH SALPINGECTOMY (Bilateral) CYSTOSCOPY (N/A) as a surgical intervention.  The patient's history has been reviewed, patient examined, no change in status, stable for surgery.  I have reviewed the patient's chart and labs.  Questions were answered to the patient's satisfaction.     [name]

## 2020-12-06 NOTE — Progress Notes (Signed)
LMP 11/14/2020    Subjective:    Patient ID: Donna Cox, female    DOB: [birth year], 30 y.o.   MRN: [id number]  HPI: Donna Cox is a 30 y.o. female  Chief Complaint  Patient presents with   Depression   Anxiety   ANXIETY/STRESS Patient states she feels a lot better.  Patient is able to go out to social events without feeling overwhelmed. Feels like her Depression and Anxiety are well controlled on her current medication regimen. Denies SI. Patient is having a hysterectomy today due to her ongoing vaginal bleeding.   GAD 7 : Generalized Anxiety Score 12/06/2020 11/09/2020 10/12/2020 09/19/2020  Nervous, Anxious, on Edge 0 1 2 3   Control/stop worrying 0 0 1 3  Worry too much - different things 0 1 0 3  Trouble relaxing 1 0 0 3  Restless 0 0 0 0  Easily annoyed or irritable 0 1 1 0  Afraid - awful might happen 0 0 1 0  Total GAD 7 Score 1 3 5 12   Anxiety Difficulty Not difficult at all Not difficult at all Not difficult at all Not difficult at all    [facility] Video Visit from 12/06/2020 in [place]  PHQ-9 Total Score 2         Relevant past medical, surgical, family and social history reviewed and updated as indicated. Interim medical history since our last visit reviewed. Allergies and medications reviewed and updated.  Review of Systems  Psychiatric/Behavioral:  Negative for dysphoric mood and suicidal ideas. The patient is not nervous/anxious.    Per HPI unless specifically indicated above     Objective:    LMP 11/14/2020   Wt Readings from Last 3 Encounters:  11/29/20 225 lb (102.1 kg)  11/27/20 225 lb (102.1 kg)  11/14/20 225 lb (102.1 kg)    Physical Exam Vitals and nursing note reviewed.  HENT:     Head: Normocephalic.     Right Ear: Hearing normal.     Left Ear: Hearing normal.     Nose: Nose normal.  Eyes:     Pupils: Pupils are equal, round, and reactive to light.  Pulmonary:     Effort: Pulmonary effort is normal. No  respiratory distress.  Neurological:     Mental Status: She is alert.  Psychiatric:        Mood and Affect: Mood normal.        Behavior: Behavior normal.        Thought Content: Thought content normal.        Judgment: Judgment normal.    Results for orders placed or performed during the hospital encounter of 12/04/20  SARS CORONAVIRUS 2 (TAT 6-24 HRS) Nasopharyngeal Nasopharyngeal Swab   Specimen: Nasopharyngeal Swab  Result Value Ref Range   SARS Coronavirus 2 NEGATIVE NEGATIVE  CBC  Result Value Ref Range   WBC 7.4 4.0 - 10.5 K/uL   RBC 3.80 (L) 3.87 - 5.11 MIL/uL   Hemoglobin 10.7 (L) 12.0 - 15.0 g/dL   HCT 31.8 (L) 36.0 - 46.0 %   MCV 83.7 80.0 - 100.0 fL   MCH 28.2 26.0 - 34.0 pg   MCHC 33.6 30.0 - 36.0 g/dL   RDW 13.2 11.5 - 15.5 %   Platelets 274 150 - 400 K/uL   nRBC 0.0 0.0 - 0.2 %  Basic metabolic panel  Result Value Ref Range   Sodium 137 135 - 145 mmol/L   Potassium 3.7 3.5 - 5.1 mmol/L  Chloride 105 98 - 111 mmol/L   CO2 25 22 - 32 mmol/L   Glucose, Bld 112 (H) 70 - 99 mg/dL   BUN 14 6 - 20 mg/dL   Creatinine, Ser 0.82 0.44 - 1.00 mg/dL   Calcium 8.8 (L) 8.9 - 10.3 mg/dL   GFR, Estimated >60 >60 mL/min   Anion gap 7 5 - 15  Type and screen  Result Value Ref Range   ABO/RH(D) O POS    Antibody Screen NEG    Sample Expiration 12/18/2020,2359    Extend sample reason      NO TRANSFUSIONS OR PREGNANCY IN THE PAST 3 MONTHS Performed at [facility], [address] [place], [place] [address]       Assessment & Plan:   Problem List Items Addressed This Visit       Other   Anxiety and depression    Chronic.  Controlled.  Will continue current dose of Effexor. Follow up in 3 months.  Call sooner if concerns arise.  Patient Will come in for blood work once she has recovered from upcoming surgery.          Follow up plan: Return in about 3 months (around 03/08/2021) for Depression/Anxiety FU (virutal okay).  This visit was completed via  MyChart due to the restrictions of the COVID-19 pandemic. All issues as above were discussed and addressed. Physical exam was done as above through visual confirmation on MyChart. If it was felt that the patient should be evaluated in the office, they were directed there. The patient verbally consented to this visit. Location of the patient: Home Location of the provider: Office Those involved with this call:  Provider: Jon Billings, NP CMA: Lowry Ram, CMA Front Desk/Registration: Jill Side Time spent on call: 15 minutes with patient face to face via video conference. More than 50% of this time was spent in counseling and coordination of care. 20 minutes total spent in review of patient's record and preparation of their chart.

## 2020-12-07 DIAGNOSIS — N840 Polyp of corpus uteri: Secondary | ICD-10-CM | POA: Diagnosis not present

## 2020-12-07 LAB — CBC
HCT: 29.6 % — ABNORMAL LOW (ref 36.0–46.0)
Hemoglobin: 10 g/dL — ABNORMAL LOW (ref 12.0–15.0)
MCH: 28.5 pg (ref 26.0–34.0)
MCHC: 33.8 g/dL (ref 30.0–36.0)
MCV: 84.3 fL (ref 80.0–100.0)
Platelets: 280 10*3/uL (ref 150–400)
RBC: 3.51 MIL/uL — ABNORMAL LOW (ref 3.87–5.11)
RDW: 13.4 % (ref 11.5–15.5)
WBC: 12 10*3/uL — ABNORMAL HIGH (ref 4.0–10.5)
nRBC: 0 % (ref 0.0–0.2)

## 2020-12-07 LAB — BASIC METABOLIC PANEL
Anion gap: 5 (ref 5–15)
BUN: 13 mg/dL (ref 6–20)
CO2: 27 mmol/L (ref 22–32)
Calcium: 8.6 mg/dL — ABNORMAL LOW (ref 8.9–10.3)
Chloride: 105 mmol/L (ref 98–111)
Creatinine, Ser: 0.83 mg/dL (ref 0.44–1.00)
GFR, Estimated: >60 mL/min (ref >60)
Glucose, Bld: 143 mg/dL — ABNORMAL HIGH (ref 70–99)
Potassium: 4.5 mmol/L (ref 3.5–5.1)
Sodium: 137 mmol/L (ref 135–145)

## 2020-12-07 MED ORDER — LIDOCAINE-PRILOCAINE 2.5-2.5 % EX CREA: 1.0000 "application " | TOPICAL_CREAM | CUTANEOUS | 0 refills | Status: DC | PRN

## 2020-12-07 MED ORDER — HYDROMORPHONE HCL 2 MG PO TABS: 2.0000 mg | ORAL_TABLET | ORAL | 0 refills | Status: AC | PRN

## 2020-12-07 NOTE — Progress Notes (Signed)
Pt discharged home. Discharge instructions, prescriptions, and follow up appointments given to and reviewed with pt. Pt verbalized understanding. Escorted by axillary.

## 2020-12-07 NOTE — Anesthesia Postprocedure Evaluation (Signed)
Anesthesia Post Note  Patient: Donna Cox  Procedure(s) Performed: TOTAL LAPAROSCOPIC HYSTERECTOMY WITH SALPINGECTOMY (Bilateral) CYSTOSCOPY  Patient location during evaluation: PACU Anesthesia Type: General Level of consciousness: awake and alert Pain management: pain level controlled Vital Signs Assessment: post-procedure vital signs reviewed and stable Respiratory status: spontaneous breathing, nonlabored ventilation and respiratory function stable Cardiovascular status: blood pressure returned to baseline and stable Postop Assessment: no apparent nausea or vomiting Anesthetic complications: no   No notable events documented.   Last Vitals:  Vitals:   12/07/20 0302 12/07/20 0839  BP: 118/60 102/77  Pulse: 94 78  Resp: 20 18  Temp: 36.9 C 36.7 C  SpO2: 99% 100%    Last Pain:  Vitals:   12/07/20 0839  TempSrc: Oral  PainSc:                  [name]

## 2020-12-07 NOTE — Discharge Summary (Signed)
Physician Discharge Summary  Patient ID: Donna Cox MRN: [id number] DOB/AGE: 09/06/1990 30 y.o.  Admit date: 12/06/2020 Discharge date: 12/07/2020  Admission Diagnoses:  Discharge Diagnoses:  Active Problems:   Abnormal uterine bleeding   S/P laparoscopic hysterectomy Postoperative pain  Discharged Condition: stable  Hospital Course: 30 y.o. [id number] admitted for scheduled TLH, BS, and cystoscopy for abnormal uterine bleeding failing medical management.  Procedure uncomplicated but patient with pain out of proportion postoperatively with a hot spot on the anterior abdominal wall attributed to fascial stitch but requiring overnight admission for pain control.  Initially required Fentanyl pushes.  Was switched to po percocet with inadequate control.  Switch to po dilaudid achieved adequate pain control overnight.  Will also add topical lidocaine and discussed addition of gabapentin as additional options.  She remained hemodynamically stable and afebrile throughout admission.  Voided without difficulty.  Postop day 1 labs revealed appropriate H&H and WBC, as well as stable BUN/Cr.    Consults: None  Significant Diagnostic Studies:  Results for orders placed or performed during the hospital encounter of 12/06/20 (from the past 24 hour(s))  Pregnancy, urine POC     Status: None   Collection Time: 12/06/20  9:31 AM  Result Value Ref Range   Preg Test, Ur NEGATIVE NEGATIVE  CBC     Status: Abnormal   Collection Time: 12/07/20  5:23 AM  Result Value Ref Range   WBC 12.0 (H) 4.0 - 10.5 K/uL   RBC 3.51 (L) 3.87 - 5.11 MIL/uL   Hemoglobin 10.0 (L) 12.0 - 15.0 g/dL   HCT 29.6 (L) 36.0 - 46.0 %   MCV 84.3 80.0 - 100.0 fL   MCH 28.5 26.0 - 34.0 pg   MCHC 33.8 30.0 - 36.0 g/dL   RDW 13.4 11.5 - 15.5 %   Platelets 280 150 - 400 K/uL   nRBC 0.0 0.0 - 0.2 %  Basic metabolic panel     Status: Abnormal   Collection Time: 12/07/20  5:23 AM  Result Value Ref Range   Sodium 137 135 - 145 mmol/L    Potassium 4.5 3.5 - 5.1 mmol/L   Chloride 105 98 - 111 mmol/L   CO2 27 22 - 32 mmol/L   Glucose, Bld 143 (H) 70 - 99 mg/dL   BUN 13 6 - 20 mg/dL   Creatinine, Ser 0.83 0.44 - 1.00 mg/dL   Calcium 8.6 (L) 8.9 - 10.3 mg/dL   GFR, Estimated >60 >60 mL/min   Anion gap 5 5 - 15     Treatments: IV hydration and analgesia: Dilaudid and Morphine  Discharge Exam: Blood pressure 102/77, pulse 78, temperature 98.1 F (36.7 C), temperature source Oral, resp. rate 18, height 5\' 8"  (1.727 m), weight 102.5 kg, last menstrual period 11/14/2020, SpO2 100 %, unknown if currently breastfeeding. General: NAD Pulmonary: no increased work of breathing Abdomen: soft, still tenderness right lower quadrant halfway between umbilical and RLQ port site, non-distended, incision(s) D/C/I Extremities: no edema Neurologic: normal gait  Disposition: Discharge disposition: 01-Home or Self Care      Discharge Instructions     Call MD for:   Complete by: As directed    Heavy vaginal bleeding greater than 1 pad an hour   Call MD for:  difficulty breathing, headache or visual disturbances   Complete by: As directed    Call MD for:  extreme fatigue   Complete by: As directed    Call MD for:  hives   Complete by: As directed  Call MD for:  persistant dizziness or light-headedness   Complete by: As directed    Call MD for:  persistant nausea and vomiting   Complete by: As directed    Call MD for:  redness, tenderness, or signs of infection (pain, swelling, redness, odor or green/yellow discharge around incision site)   Complete by: As directed    Call MD for:  severe uncontrolled pain   Complete by: As directed    Call MD for:  temperature >100.4   Complete by: As directed    Diet general   Complete by: As directed    Discharge wound care:   Complete by: As directed    You may apply a light dressing for minor discharge from the incision or to keep waistbands of clothing from rubbing.  You may also  have been discharge with a clear dressing in which case this will be removed at your postoperative clinic visit.  You may shower, use soap on your incision.  Avoid baths or soaking the incision in the first 6 weeks following your surgery..   Driving restriction   Complete by: As directed    Avoid driving for at least 1 weeks or while taking prescription narcotics.   Lifting restrictions   Complete by: As directed    Weight restriction of 10lbs for 6 weeks.      Allergies as of 12/07/2020   No Known Allergies      Medication List     TAKE these medications    acetaminophen 325 MG tablet Commonly known as: TYLENOL Take 650 mg by mouth every 6 (six) hours as needed for moderate pain.   HYDROmorphone 2 MG tablet Commonly known as: Dilaudid Take 1-2 tablets (2-4 mg total) by mouth every 4 (four) hours as needed for up to 5 days for severe pain or moderate pain.   ibuprofen 600 MG tablet Commonly known as: ADVIL Take 1 tablet (600 mg total) by mouth every 6 (six) hours as needed.   levothyroxine 50 MCG tablet Commonly known as: Synthroid Take 1 tablet (50 mcg total) by mouth daily before breakfast.   lidocaine-prilocaine cream Commonly known as: EMLA Apply 1 application topically as needed.   phentermine 37.5 MG tablet Commonly known as: ADIPEX-P Take 1 tablet (37.5 mg total) by mouth daily before breakfast.   venlafaxine XR 75 MG 24 hr capsule Commonly known as: Effexor XR Take 1 capsule (75 mg total) by mouth daily with breakfast.               Discharge Care Instructions  (From admission, onward)           Start     Ordered   12/06/20 0000  Discharge wound care:       Comments: You may apply a light dressing for minor discharge from the incision or to keep waistbands of clothing from rubbing.  You may also have been discharge with a clear dressing in which case this will be removed at your postoperative clinic visit.  You may shower, use soap on your  incision.  Avoid baths or soaking the incision in the first 6 weeks following your surgery.Marland Kitchen   12/06/20 1252            Follow-up Information     [name], MD. Go in 1 week(s).   Specialty: Obstetrics and Gynecology Why: You already have a post op appt schedule June 22nd at 9:50am at the [facility] office. That address is [address]  [place], [place] [address] Phone: [phone] Contact information: [address] [place] [address] [phone]                 Signed: [name] 12/07/2020, 8:48 AM

## 2020-12-10 ENCOUNTER — Telehealth: Payer: Self-pay

## 2020-12-10 LAB — SURGICAL PATHOLOGY

## 2020-12-10 NOTE — Telephone Encounter (Signed)
LVM  Return in about 3 months (around 03/08/2021) for Depression/Anxiety FU (virutal okay).

## 2020-12-10 NOTE — Telephone Encounter (Signed)
-----   Message from Jon Billings, NP sent at 12/06/2020  8:31 AM EDT ----- Can we schedule her follow up?

## 2020-12-12 ENCOUNTER — Telehealth: Payer: 59 | Admitting: Nurse Practitioner

## 2020-12-12 ENCOUNTER — Other Ambulatory Visit: Payer: Self-pay

## 2020-12-12 ENCOUNTER — Ambulatory Visit (INDEPENDENT_AMBULATORY_CARE_PROVIDER_SITE_OTHER): Payer: 59 | Admitting: Obstetrics and Gynecology

## 2020-12-12 ENCOUNTER — Encounter: Payer: Self-pay | Admitting: Obstetrics and Gynecology

## 2020-12-12 VITALS — BP 131/64 | Ht 68.0 in | Wt 224.0 lb

## 2020-12-12 DIAGNOSIS — Z4889 Encounter for other specified surgical aftercare: Secondary | ICD-10-CM

## 2020-12-12 DIAGNOSIS — N939 Abnormal uterine and vaginal bleeding, unspecified: Secondary | ICD-10-CM

## 2020-12-12 DIAGNOSIS — Z90710 Acquired absence of both cervix and uterus: Secondary | ICD-10-CM

## 2020-12-12 NOTE — Progress Notes (Signed)
Postoperative Follow-up Patient presents post op from [place], BS, cystoscopy 1weeks ago for abnormal uterine bleeding.  Subjective: Patient reports marked improvement in her preop symptoms. Eating a regular diet without difficulty. Pain is controlled without any medications.  Activity: normal activities of daily living.  Objective: Blood pressure 131/64, height 5\' 8"  (1.727 m), weight 224 lb (101.6 kg), last menstrual period 11/14/2020, unknown if currently breastfeeding.  General: NAD Pulmonary: no increased work of breathing Abdomen: soft, non-tender, non-distended, incision(s) D/C/I Extremities: no edema Neurologic: normal gait   Admission on 12/06/2020, Discharged on 12/07/2020  Component Date Value Ref Range Status   Preg Test, Ur 12/06/2020 NEGATIVE  NEGATIVE Final   Comment:        THE SENSITIVITY OF THIS METHODOLOGY IS >24 mIU/mL    SURGICAL PATHOLOGY 12/06/2020    Final-Edited                   Value:SURGICAL PATHOLOGY CASE: [id number] PATIENT: Donna Cox Surgical Pathology Report     Specimen Submitted: A. Uterus, cervix, bilateral tubes  Clinical History: Abnormal uterine bleeding     DIAGNOSIS: A. UTERUS WITH CERVIX; HYSTERECTOMY: - CERVIX WITHOUT PATHOLOGIC CHANGES. 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 - MYOMETRIUM WITHOUT PATHOLOGIC CHANGES. - NEGATIVE FOR ATYPIA AND MALIGNANCY.  BILATERAL FALLOPIAN TUBES; SALPINGECTOMY: - NO PATHOLOGIC CHANGES.  GROSS DESCRIPTION: A. Labeled: Uterus with cervix, bilateral tubes Received: In formalin Collection time: 11:30 AM on 12/06/2020 Placed into formalin time: 12:01 PM on 12/06/2020 Weight: 122 grams Dimensions:      Fundus -6.7 x 7 x 3.5 cm      Cervix -3.7 x 3.5 x 2.8 cm Serosa: Pale pink and smooth Cervix: Cervical os is patent and measures up to 0.4 cm in diameter. The ectocervical mucosa is pink-red and somewhat granular toward the os. Endocervix:  The squ                         amocolumnar junction is not distinct.  The endocervix is pale-tan and soft Endometrial cavity:      Dimensions -5.3 x 2 x 1.3 cm      Thickness -markedly thickened and congested from 0.1-0.6 cm      Other findings -there are multiple pink-red pedunculated polyps on the endometrial lining ranging from 0.4 up to 1.5 x 0.8 x 0.2 cm noted. Myometrium:     Thickness -up to 2 cm     Other findings -the myometrium is pale-tan and trabeculated with no masses or nodularities grossly noted. Adnexa: The bilateral ovaries are not received      Right fallopian tube-weighs 4 g           Measurements -7.2 cm in length x 0.5-0.9 cm in diameter           Other findings -there are several paratubal cysts measuring up to 0.1 cm in diameter noted, containing clear fluid.      Left fallopian tube-weighs 3 g            Measurements -5.8 cm in length x 0.5-0.7 cm in diameter           Other findings -there are several paratubal cysts measuring up to 0.1 cm in diameter noted, containing cle                         ar fluid. Other comments: None  Block summary: 1-2-cervix  3-6-endomyometrium including endometrial polyps 7-8-entire longitudinally sectioned fimbriated end of right fallopian tube and representative cross-sections 9-10-entire longitudinally sectioned fimbriated end of left fallopian tube and representative cross-sections  BD 12/07/2020  Final Diagnosis performed by Bryan Lemma, MD.   Electronically signed 12/10/2020 6:39:00PM The electronic signature indicates that the named Attending Pathologist has evaluated the specimen Technical component performed at [facility], [address], [place], [place] [address] Lab: [phone] Dir: Rush Farmer, MD, MMM  Professional component performed at [facility], [facility], [place], [place], [place] [address] Lab: [phone] Dir: Dellia Nims. Rubinas, MD    WBC 12/07/2020 12.0 (A) 4.0 - 10.5 K/uL  Final   RBC 12/07/2020 3.51 (A) 3.87 - 5.11 MIL/uL Final   Hemoglobin 12/07/2020 10.0 (A) 12.0 - 15.0 g/dL Final   HCT 12/07/2020 29.6 (A) 36.0 - 46.0 % Final   MCV 12/07/2020 84.3  80.0 - 100.0 fL Final   MCH 12/07/2020 28.5  26.0 - 34.0 pg Final   MCHC 12/07/2020 33.8  30.0 - 36.0 g/dL Final   RDW 12/07/2020 13.4  11.5 - 15.5 % Final   Platelets 12/07/2020 280  150 - 400 K/uL Final   nRBC 12/07/2020 0.0  0.0 - 0.2 % Final   Performed at [facility], [place], [place] [address]   Sodium 12/07/2020 137  135 - 145 mmol/L Final   Potassium 12/07/2020 4.5  3.5 - 5.1 mmol/L Final   Chloride 12/07/2020 105  98 - 111 mmol/L Final   CO2 12/07/2020 27  22 - 32 mmol/L Final   Glucose, Bld 12/07/2020 143 (A) 70 - 99 mg/dL Final   Glucose reference range applies only to samples taken after fasting for at least 8 hours.   BUN 12/07/2020 13  6 - 20 mg/dL Final   Creatinine, Ser 12/07/2020 0.83  0.44 - 1.00 mg/dL Final   Calcium 12/07/2020 8.6 (A) 8.9 - 10.3 mg/dL Final   GFR, Estimated 12/07/2020 >60  >60 mL/min Final   Comment: (NOTE) Calculated using the CKD-EPI Creatinine Equation (2021)    Anion gap 12/07/2020 5  5 - 15 Final   Performed at [facility], [address], [place], [place] [address]    Assessment: 29 y.o. s/p TLH, BS, cystoscopy stable  Plan: Patient has done well after surgery with no apparent complications.  I have discussed the post-operative course to date, and the expected progress moving forward.  The patient understands what complications to be concerned about.  I will see the patient in routine follow up, or sooner if needed.    Activity plan: No heavy lifting.   [name], MD, [facility], [facility] 12/12/2020, 10:33 AM

## 2020-12-13 ENCOUNTER — Emergency Department
Admission: EM | Admit: 2020-12-13 | Discharge: 2020-12-13 | Disposition: A | Discharge status: Home / Self Care | Payer: 59 | Attending: Emergency Medicine | Admitting: Emergency Medicine

## 2020-12-13 ENCOUNTER — Encounter: Payer: Self-pay | Admitting: *Deleted

## 2020-12-13 ENCOUNTER — Other Ambulatory Visit: Payer: Self-pay

## 2020-12-13 DIAGNOSIS — R109 Unspecified abdominal pain: Secondary | ICD-10-CM | POA: Insufficient documentation

## 2020-12-13 DIAGNOSIS — Z5321 Procedure and treatment not carried out due to patient leaving prior to being seen by health care provider: Secondary | ICD-10-CM | POA: Insufficient documentation

## 2020-12-13 LAB — COMPREHENSIVE METABOLIC PANEL
ALT: 16 U/L (ref 0–44)
AST: 16 U/L (ref 15–41)
Albumin: 3.9 g/dL (ref 3.5–5.0)
Alkaline Phosphatase: 72 U/L (ref 38–126)
Anion gap: 6 (ref 5–15)
BUN: 16 mg/dL (ref 6–20)
CO2: 26 mmol/L (ref 22–32)
Calcium: 8.9 mg/dL (ref 8.9–10.3)
Chloride: 104 mmol/L (ref 98–111)
Creatinine, Ser: 0.92 mg/dL (ref 0.44–1.00)
GFR, Estimated: >60 mL/min (ref >60)
Glucose, Bld: 113 mg/dL — ABNORMAL HIGH (ref 70–99)
Potassium: 3.5 mmol/L (ref 3.5–5.1)
Sodium: 136 mmol/L (ref 135–145)
Total Bilirubin: 0.5 mg/dL (ref 0.3–1.2)
Total Protein: 7.3 g/dL (ref 6.5–8.1)

## 2020-12-13 LAB — CBC
HCT: 31.5 % — ABNORMAL LOW (ref 36.0–46.0)
Hemoglobin: 10.6 g/dL — ABNORMAL LOW (ref 12.0–15.0)
MCH: 27.7 pg (ref 26.0–34.0)
MCHC: 33.7 g/dL (ref 30.0–36.0)
MCV: 82.2 fL (ref 80.0–100.0)
Platelets: 273 10*3/uL (ref 150–400)
RBC: 3.83 MIL/uL — ABNORMAL LOW (ref 3.87–5.11)
RDW: 13.1 % (ref 11.5–15.5)
WBC: 8.7 10*3/uL (ref 4.0–10.5)
nRBC: 0 % (ref 0.0–0.2)

## 2020-12-13 LAB — LIPASE, BLOOD: Lipase: 39 U/L (ref 11–51)

## 2020-12-13 LAB — LACTIC ACID, PLASMA: Lactic Acid, Venous: 1 mmol/L (ref 0.5–1.9)

## 2020-12-13 NOTE — ED Triage Notes (Signed)
Pt states that she was reaching for a cup and felt a "pop" in the right side of her abdomen. She says she was having pain in this area prior to tonight, but now the pain is worse and goes into her back. Denies any vaginal bleeding. Surgical wounds c/d/I. No meds for pain PTA.

## 2020-12-13 NOTE — ED Triage Notes (Signed)
Pt arrives via [facility] with c/o abdominal pain that started tonight. She had hysterectomy 6 days ago, laparoscopic. Tonight, she reached for something and heard a "pop", having pain since. 109/76, hr 95 and sats 98% en route.

## 2020-12-13 NOTE — Telephone Encounter (Signed)
Lvm to make this apt. 

## 2020-12-13 NOTE — Telephone Encounter (Signed)
Pt called after hour nurse 12/13/20 2:53am; had hyst 6d ago; when she made a turn she felt a 'pop' around her umbilicus on the right side and now she is having severe pain; radiating to her back; 8/10 on pain scale; constant; moving makes it worse. No other sxs.  After hour nurse adv her to go to the ED; pt states she had been to the ED and left after triage b/c they told her it would be at least a four hour wait.  [place]

## 2020-12-14 NOTE — Telephone Encounter (Signed)
Lvm to make apt.  

## 2020-12-20 ENCOUNTER — Encounter: Payer: Self-pay | Admitting: Obstetrics and Gynecology

## 2020-12-20 ENCOUNTER — Other Ambulatory Visit: Payer: Self-pay

## 2020-12-20 ENCOUNTER — Ambulatory Visit (INDEPENDENT_AMBULATORY_CARE_PROVIDER_SITE_OTHER): Payer: 59 | Admitting: Obstetrics and Gynecology

## 2020-12-20 VITALS — BP 120/73 | Wt 227.0 lb

## 2020-12-20 DIAGNOSIS — R112 Nausea with vomiting, unspecified: Secondary | ICD-10-CM

## 2020-12-20 DIAGNOSIS — G8918 Other acute postprocedural pain: Secondary | ICD-10-CM

## 2020-12-20 NOTE — Progress Notes (Signed)
Postoperative Follow-up Patient presents post op from [place], BS, cystoscopy 2weeks ago for abnormal uterine bleeding.  Subjective: Patient reports some improvement in her preop symptoms. The abdominal wall hot spot on the right has improved.  She has noted more consistent right lower quadrant pain radiating into her back with movement.  She did jump in the pool when her daughter fell in recently.  Last bowl movement 2 days ago has had some discomfort with bowl movement.  Eating a regular diet with difficulty.   Objective: Blood pressure 120/73, weight 227 lb (103 kg), last menstrual period 11/14/2020, unknown if currently breastfeeding.  General: NAD Pulmonary: no increased work of breathing Abdomen: soft, non-tender, non-distended, incision D/C/I Extremities: no edema Neurologic: normal gait   Admission on 12/13/2020, Discharged on 12/13/2020  Component Date Value Ref Range Status   Lipase 12/13/2020 39  11 - 51 U/L Final   Performed at [facility], [address] [place], [place] [address]   Sodium 12/13/2020 136  135 - 145 mmol/L Final   Potassium 12/13/2020 3.5  3.5 - 5.1 mmol/L Final   Chloride 12/13/2020 104  98 - 111 mmol/L Final   CO2 12/13/2020 26  22 - 32 mmol/L Final   Glucose, Bld 12/13/2020 113 (A) 70 - 99 mg/dL Final   Glucose reference range applies only to samples taken after fasting for at least 8 hours.   BUN 12/13/2020 16  6 - 20 mg/dL Final   Creatinine, Ser 12/13/2020 0.92  0.44 - 1.00 mg/dL Final   Calcium 12/13/2020 8.9  8.9 - 10.3 mg/dL Final   Total Protein 12/13/2020 7.3  6.5 - 8.1 g/dL Final   Albumin 12/13/2020 3.9  3.5 - 5.0 g/dL Final   AST 12/13/2020 16  15 - 41 U/L Final   ALT 12/13/2020 16  0 - 44 U/L Final   Alkaline Phosphatase 12/13/2020 72  38 - 126 U/L Final   Total Bilirubin 12/13/2020 0.5  0.3 - 1.2 mg/dL Final   GFR, Estimated 12/13/2020 >60  >60 mL/min Final   Comment: (NOTE) Calculated using the CKD-EPI Creatinine  Equation (2021)    Anion gap 12/13/2020 6  5 - 15 Final   Performed at [facility], [address] [place], [place] [address]   WBC 12/13/2020 8.7  4.0 - 10.5 K/uL Final   RBC 12/13/2020 3.83 (A) 3.87 - 5.11 MIL/uL Final   Hemoglobin 12/13/2020 10.6 (A) 12.0 - 15.0 g/dL Final   HCT 12/13/2020 31.5 (A) 36.0 - 46.0 % Final   MCV 12/13/2020 82.2  80.0 - 100.0 fL Final   MCH 12/13/2020 27.7  26.0 - 34.0 pg Final   MCHC 12/13/2020 33.7  30.0 - 36.0 g/dL Final   RDW 12/13/2020 13.1  11.5 - 15.5 % Final   Platelets 12/13/2020 273  150 - 400 K/uL Final   nRBC 12/13/2020 0.0  0.0 - 0.2 % Final   Performed at [facility], [address] [place], [place] [address]   Lactic Acid, Venous 12/13/2020 1.0  0.5 - 1.9 mmol/L Final   Performed at [facility], [address] [place], [place] [address]    Assessment: 30 y.o. s/p TLH, BS, cystoscopy stable  Plan: Patient has done well after surgery with no apparent complications.  I have discussed the post-operative course to date, and the expected progress moving forward.  The patient understands what complications to be concerned about.  I will see the patient in  routine follow up, or sooner if needed.    Activity plan: No heavy lifting. No intercourse  Constipation - recommend Miralax, fleets enema  Abdominal pain - will recheck CBC and [place], MD, [place], [facility] 12/20/2020, 9:36 AM

## 2020-12-21 ENCOUNTER — Other Ambulatory Visit: Payer: Self-pay | Admitting: Obstetrics and Gynecology

## 2020-12-21 ENCOUNTER — Telehealth: Payer: Self-pay

## 2020-12-21 LAB — COMPREHENSIVE METABOLIC PANEL
ALT: 14 IU/L (ref 0–32)
AST: 11 IU/L (ref 0–40)
Albumin/Globulin Ratio: 1.6 (ref 1.2–2.2)
Albumin: 4.2 g/dL (ref 3.9–5.0)
Alkaline Phosphatase: 88 IU/L (ref 44–121)
BUN/Creatinine Ratio: 18 (ref 9–23)
BUN: 13 mg/dL (ref 6–20)
Bilirubin Total: 0.2 mg/dL (ref 0.0–1.2)
CO2: 22 mmol/L (ref 20–29)
Calcium: 9.2 mg/dL (ref 8.7–10.2)
Chloride: 104 mmol/L (ref 96–106)
Creatinine, Ser: 0.73 mg/dL (ref 0.57–1.00)
Globulin, Total: 2.7 g/dL (ref 1.5–4.5)
Glucose: 94 mg/dL (ref 65–99)
Potassium: 4.5 mmol/L (ref 3.5–5.2)
Sodium: 139 mmol/L (ref 134–144)
Total Protein: 6.9 g/dL (ref 6.0–8.5)
eGFR: 116 mL/min/{1.73_m2} (ref >59)

## 2020-12-21 LAB — CBC
Hematocrit: 35.9 % (ref 34.0–46.6)
Hemoglobin: 11.4 g/dL (ref 11.1–15.9)
MCH: 26.7 pg (ref 26.6–33.0)
MCHC: 31.8 g/dL (ref 31.5–35.7)
MCV: 84 fL (ref 79–97)
Platelets: 285 10*3/uL (ref 150–450)
RBC: 4.27 x10E6/uL (ref 3.77–5.28)
RDW: 13.3 % (ref 11.7–15.4)
WBC: 7.2 10*3/uL (ref 3.4–10.8)

## 2020-12-21 MED ORDER — NITROFURANTOIN MONOHYD MACRO 100 MG PO CAPS: 100.0000 mg | ORAL_CAPSULE | Freq: Two times a day (BID) | ORAL | 0 refills | Status: AC

## 2020-12-21 NOTE — Telephone Encounter (Signed)
Pt called stating [employer] wasn't accepting the Healthcare Provider's Statement b/c it was filled out before the surgery.  Date was change with a note stating we always fill them out with the FMLA/Disability forms.  No new information.  Postop visits from June faxed.

## 2020-12-21 NOTE — Telephone Encounter (Signed)
Left detailed msg that I refaxed the form and the last two office visit notes; confirmation received.

## 2021-01-17 ENCOUNTER — Encounter: Payer: Self-pay | Admitting: Obstetrics and Gynecology

## 2021-01-17 ENCOUNTER — Ambulatory Visit (INDEPENDENT_AMBULATORY_CARE_PROVIDER_SITE_OTHER): Payer: 59 | Admitting: Obstetrics and Gynecology

## 2021-01-17 ENCOUNTER — Other Ambulatory Visit: Payer: Self-pay

## 2021-01-17 VITALS — BP 159/90 | Ht 68.0 in | Wt 228.0 lb

## 2021-01-17 DIAGNOSIS — R1031 Right lower quadrant pain: Secondary | ICD-10-CM | POA: Diagnosis not present

## 2021-01-17 DIAGNOSIS — G8918 Other acute postprocedural pain: Secondary | ICD-10-CM

## 2021-01-17 DIAGNOSIS — Z4889 Encounter for other specified surgical aftercare: Secondary | ICD-10-CM

## 2021-01-17 NOTE — Progress Notes (Signed)
Postoperative Follow-up Patient presents post op from [place], BS, cystoscopy 2weeks ago for abnormal uterine bleeding.  Subjective: Patient reports little improvement in her preop symptoms. Eating a regular diet without difficulty. Pain is not well controlled.  Medications being used: ibuprofen (OTC).  She continues to report out of proportion right lower quadrant pain, the pain is superficial located just inferior and medial the right lower quadrant port site.    Objective: Blood pressure (!) 159/90, height 5' 8" (1.727 m), weight 228 lb (103.4 kg), last menstrual period 11/14/2020, unknown if currently breastfeeding.  General: NAD Pulmonary: no increased work of breathing Abdomen: soft, non-tender, non-distended, incision(s) D/C/I.  No evidence of hernia.  The right lower quadrant port site was injected with 3cc of 1% lidocaine without improvement noted by the patient in her pain. GU: normal external female genitalia vaginal cuff intact, well healed Extremities: no edema Neurologic: normal gait   Office Visit on 12/20/2020  Component Date Value Ref Range Status   WBC 12/20/2020 7.2  3.4 - 10.8 x10E3/uL Final   RBC 12/20/2020 4.27  3.77 - 5.28 x10E6/uL Final   Hemoglobin 12/20/2020 11.4  11.1 - 15.9 g/dL Final   Hematocrit 12/20/2020 35.9  34.0 - 46.6 % Final   MCV 12/20/2020 84  79 - 97 fL Final   MCH 12/20/2020 26.7  26.6 - 33.0 pg Final   MCHC 12/20/2020 31.8  31.5 - 35.7 g/dL Final   RDW 12/20/2020 13.3  11.7 - 15.4 % Final   Platelets 12/20/2020 285  150 - 450 x10E3/uL Final   Glucose 12/20/2020 94  65 - 99 mg/dL Final   BUN 12/20/2020 13  6 - 20 mg/dL Final   Creatinine, Ser 12/20/2020 0.73  0.57 - 1.00 mg/dL Final   eGFR 12/20/2020 116  >59 mL/min/1.73 Final   BUN/Creatinine Ratio 12/20/2020 18  9 - 23 Final   Sodium 12/20/2020 139  134 - 144 mmol/L Final   Potassium 12/20/2020 4.5  3.5 - 5.2 mmol/L Final   Chloride 12/20/2020 104  96 - 106 mmol/L Final   CO2  12/20/2020 22  20 - 29 mmol/L Final   Calcium 12/20/2020 9.2  8.7 - 10.2 mg/dL Final   Total Protein 12/20/2020 6.9  6.0 - 8.5 g/dL Final   Albumin 12/20/2020 4.2  3.9 - 5.0 g/dL Final   Globulin, Total 12/20/2020 2.7  1.5 - 4.5 g/dL Final   Albumin/Globulin Ratio 12/20/2020 1.6  1.2 - 2.2 Final   Bilirubin Total 12/20/2020 0.2  0.0 - 1.2 mg/dL Final   Alkaline Phosphatase 12/20/2020 88  44 - 121 IU/L Final   AST 12/20/2020 11  0 - 40 IU/L Final   ALT 12/20/2020 14  0 - 32 IU/L Final    Assessment: 30 y.o. s/p TLH, BS, cystoscopy stable  Plan: Patient has done well after surgery with no apparent complications.  I have discussed the post-operative course to date, and the expected progress moving forward.  The patient understands what complications to be concerned about.  I will see the patient in routine follow up, or sooner if needed.    Activity plan: No restriction.  Continues to report RLQ anterior abdominal wall pain. Labs normal at last visit.  Injected with 72m of 1% lidocaine today with no improvement noted by patient.  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   

## 2021-01-29 ENCOUNTER — Telehealth: Payer: Self-pay

## 2021-01-29 NOTE — Telephone Encounter (Signed)
Spoke w/patient. Advised we do not have any additional FMLA paper work/forms to complete for her. She states they just need the last office visit note. Advised will print and fax the [employer].

## 2021-01-29 NOTE — Telephone Encounter (Signed)
Faxed & confirmation received '@11'$ :40 am

## 2021-01-29 NOTE — Telephone Encounter (Signed)
Patient called stating that she has not heard about her CT scan being scheduled and her my chart showed "order expires today". I confirmed that order doesn't expire today and I will get auth and schedule it for her.  She also stated that as of 5:00 today her employer is "going to fire" her due to her FLMA not reflecting her continuing issues. I understood that the company [employer]) needs to receive updated info today.   Georgianne Fick is in OR until after lunch. I will msg him to come by the office to sign off on new documentation.   I spoke to [place] and she said to have [place] or [place] complete the [employer] paperwork and have ready for AMS post surgery today.

## 2021-01-30 ENCOUNTER — Ambulatory Visit
Admission: RE | Admit: 2021-01-30 | Discharge: 2021-01-30 | Disposition: A | Discharge status: Home / Self Care | Payer: 59 | Source: Ambulatory Visit | Attending: Obstetrics and Gynecology | Admitting: Obstetrics and Gynecology

## 2021-01-30 ENCOUNTER — Other Ambulatory Visit: Payer: Self-pay

## 2021-01-30 DIAGNOSIS — G8918 Other acute postprocedural pain: Secondary | ICD-10-CM | POA: Diagnosis present

## 2021-01-30 DIAGNOSIS — R1031 Right lower quadrant pain: Secondary | ICD-10-CM | POA: Insufficient documentation

## 2021-01-30 MED ORDER — IOHEXOL 300 MG/ML  SOLN
100.0000 mL | Freq: Once | INTRAMUSCULAR | Status: AC | PRN
  Administered 2021-01-30: 100 mL via INTRAVENOUS

## 2021-01-31 ENCOUNTER — Other Ambulatory Visit: Payer: Self-pay | Admitting: Obstetrics and Gynecology

## 2021-01-31 MED ORDER — GABAPENTIN 300 MG PO CAPS: ORAL_CAPSULE | ORAL | 3 refills | Status: DC

## 2021-01-31 NOTE — Progress Notes (Signed)
CT scan normal no pathology visualized to explain the patient's RLQ pain.  Will trial on gabapentin to see if any improvement.

## 2021-02-01 ENCOUNTER — Telehealth: Payer: Self-pay

## 2021-02-01 NOTE — Telephone Encounter (Signed)
Called and left voicemail for patient to call back to be scheduled. 

## 2021-02-01 NOTE — Telephone Encounter (Signed)
-----   Message from [name], MD sent at 01/31/2021  1:38 PM EDT ----- Regarding: Medication follow up phone Medication follow up phone 3-4 weeks

## 2021-03-06 ENCOUNTER — Ambulatory Visit: Payer: Self-pay | Admitting: Obstetrics and Gynecology

## 2021-03-06 ENCOUNTER — Other Ambulatory Visit: Payer: Self-pay

## 2021-07-31 ENCOUNTER — Ambulatory Visit: Payer: Self-pay

## 2021-07-31 ENCOUNTER — Encounter: Payer: Self-pay | Admitting: Nurse Practitioner

## 2021-07-31 ENCOUNTER — Other Ambulatory Visit: Payer: Self-pay

## 2021-07-31 ENCOUNTER — Ambulatory Visit (INDEPENDENT_AMBULATORY_CARE_PROVIDER_SITE_OTHER): Payer: Self-pay | Admitting: Nurse Practitioner

## 2021-07-31 VITALS — BP 124/69 | HR 69 | Temp 98.8°F | Wt 223.4 lb

## 2021-07-31 DIAGNOSIS — R42 Dizziness and giddiness: Secondary | ICD-10-CM

## 2021-07-31 DIAGNOSIS — R7303 Prediabetes: Secondary | ICD-10-CM

## 2021-07-31 DIAGNOSIS — Z8639 Personal history of other endocrine, nutritional and metabolic disease: Secondary | ICD-10-CM

## 2021-07-31 DIAGNOSIS — E039 Hypothyroidism, unspecified: Secondary | ICD-10-CM

## 2021-07-31 NOTE — Progress Notes (Signed)
BP 124/69    Pulse 69    Temp 98.8 F (37.1 C) (Oral)    Wt 223 lb 6.4 oz (101.3 kg)    LMP 11/14/2020    SpO2 98%    BMI 33.97 kg/m    Subjective:    Patient ID: Donna Cox, female    DOB: Feb 04, 1991, 31 y.o.   MRN: [id number]  HPI: Donna Cox is a 31 y.o. female  Chief Complaint  Patient presents with   Prediabetes    Pt states she was diagnosed with prediabetes last year. States for the last 3 months she has been getting headaches, gets sick when she eats, and has been very thirsty as well.    DIZZINESS Duration:  about a month ago Description of symptoms: lightheaded Duration of episode: minutes Dizziness frequency: no history of the same Provoking factors:  standing up quickly makes it worse Aggravating factors:   nothing specific can come on at any time Triggered by rolling over in bed: no Triggered by bending over: yes Aggravated by head movement: no Aggravated by exertion, coughing, loud noises: no Recent head injury: no Recent or current viral symptoms: no History of vasovagal episodes: no Nausea: no Vomiting: yes- if she doesn't eat and then goes to eat then she will throw up and symptoms improve. Tinnitus: no Hearing loss: no Aural fullness: no Headache: yes Photophobia/phonophobia: no Unsteady gait: yes Postural instability: no Diplopia, dysarthria, dysphagia or weakness: yes Related to exertion: no Pallor: no Diaphoresis: no Dyspnea: no Chest pain: no  Patient also complains of increased thirstiness.  States her hands and feet stay super cold.  Patient states she ran out of the levothyroxine and Effexor and was not able to get off work to come in.  [facility] Visit from 07/31/2021 in [place]  PHQ-9 Total Score 3      GAD 7 : Generalized Anxiety Score 07/31/2021 12/06/2020 11/09/2020 10/12/2020  Nervous, Anxious, on Edge 0 0 1 2  Control/stop worrying 1 0 0 1  Worry too much - different things 0 0 1 0  Trouble relaxing 1 1 0  0  Restless 0 0 0 0  Easily annoyed or irritable 0 0 1 1  Afraid - awful might happen 0 0 0 1  Total GAD 7 Score '2 1 3 5  ' Anxiety Difficulty Not difficult at all Not difficult at all Not difficult at all Not difficult at all      Relevant past medical, surgical, family and social history reviewed and updated as indicated. Interim medical history since our last visit reviewed. Allergies and medications reviewed and updated.  Review of Systems  HENT:  Negative for hearing loss and tinnitus.   Eyes:  Negative for photophobia and visual disturbance.  Cardiovascular:  Negative for chest pain.  Gastrointestinal:  Positive for vomiting. Negative for nausea.  Neurological:  Positive for dizziness and headaches.   Per HPI unless specifically indicated above     Objective:    BP 124/69    Pulse 69    Temp 98.8 F (37.1 C) (Oral)    Wt 223 lb 6.4 oz (101.3 kg)    LMP 11/14/2020    SpO2 98%    BMI 33.97 kg/m   Wt Readings from Last 3 Encounters:  07/31/21 223 lb 6.4 oz (101.3 kg)  01/17/21 228 lb (103.4 kg)  12/20/20 227 lb (103 kg)    Physical Exam Vitals and nursing note reviewed.  Constitutional:  General: She is not in acute distress.    Appearance: Normal appearance. She is normal weight. She is not ill-appearing, toxic-appearing or diaphoretic.  HENT:     Head: Normocephalic.     Right Ear: Tympanic membrane and external ear normal.     Left Ear: Tympanic membrane and external ear normal.     Nose: Nose normal. No congestion.     Mouth/Throat:     Mouth: Mucous membranes are moist.     Pharynx: Oropharynx is clear.  Eyes:     General:        Right eye: No discharge.        Left eye: No discharge.     Extraocular Movements: Extraocular movements intact.     Conjunctiva/sclera: Conjunctivae normal.     Pupils: Pupils are equal, round, and reactive to light.  Cardiovascular:     Rate and Rhythm: Normal rate and regular rhythm.     Heart sounds: No murmur  heard. Pulmonary:     Effort: Pulmonary effort is normal. No respiratory distress.     Breath sounds: Normal breath sounds. No wheezing or rales.  Musculoskeletal:     Cervical back: Normal range of motion and neck supple.  Skin:    General: Skin is warm and dry.     Capillary Refill: Capillary refill takes less than 2 seconds.  Neurological:     General: No focal deficit present.     Mental Status: She is alert and oriented to person, place, and time. Mental status is at baseline.     Cranial Nerves: Cranial nerves 2-12 are intact.     Sensory: Sensation is intact.     Motor: Motor function is intact.     Coordination: Coordination is intact.     Gait: Gait is intact.  Psychiatric:        Mood and Affect: Mood normal.        Behavior: Behavior normal.        Thought Content: Thought content normal.        Judgment: Judgment normal.    Results for orders placed or performed in visit on 12/20/20  CBC  Result Value Ref Range   WBC 7.2 3.4 - 10.8 x10E3/uL   RBC 4.27 3.77 - 5.28 x10E6/uL   Hemoglobin 11.4 11.1 - 15.9 g/dL   Hematocrit 35.9 34.0 - 46.6 %   MCV 84 79 - 97 fL   MCH 26.7 26.6 - 33.0 pg   MCHC 31.8 31.5 - 35.7 g/dL   RDW 13.3 11.7 - 15.4 %   Platelets 285 150 - 450 x10E3/uL  Comprehensive metabolic panel  Result Value Ref Range   Glucose 94 65 - 99 mg/dL   BUN 13 6 - 20 mg/dL   Creatinine, Ser 0.73 0.57 - 1.00 mg/dL   eGFR 116 >59 mL/min/1.73   BUN/Creatinine Ratio 18 9 - 23   Sodium 139 134 - 144 mmol/L   Potassium 4.5 3.5 - 5.2 mmol/L   Chloride 104 96 - 106 mmol/L   CO2 22 20 - 29 mmol/L   Calcium 9.2 8.7 - 10.2 mg/dL   Total Protein 6.9 6.0 - 8.5 g/dL   Albumin 4.2 3.9 - 5.0 g/dL   Globulin, Total 2.7 1.5 - 4.5 g/dL   Albumin/Globulin Ratio 1.6 1.2 - 2.2   Bilirubin Total 0.2 0.0 - 1.2 mg/dL   Alkaline Phosphatase 88 44 - 121 IU/L   AST 11 0 - 40 IU/L   ALT 14  0 - 32 IU/L      Assessment & Plan:   Problem List Items Addressed This Visit        Endocrine   Hypothyroid - Primary    Chronic. Has been out of medication. Not sure how long it has been.  Symptoms have been going on for about a month but have worsened in the last few days.  Will check labs at visit today. Will wait for results to determine dose of medication patient needs.  Will make recommendations based on results.       Relevant Orders   TSH   T4, free     Other   History of vitamin D deficiency    Labs ordered today. Will make recommendations based on lab results.       Relevant Orders   Vitamin D (25 hydroxy)   Other Visit Diagnoses     Dizziness       Ongoing x 1 month. Likely related to hypothyroidism.  Will await lab results and make recommendations based on that.    Relevant Orders   Comp Met (CMET)   CBC w/Diff   Prediabetes       Labs ordered today. Will make recommendations based on lab results.    Relevant Orders   HgB A1c        Follow up plan: Return if symptoms worsen or fail to improve.

## 2021-07-31 NOTE — Assessment & Plan Note (Signed)
Chronic. Has been out of medication. Not sure how long it has been.  Symptoms have been going on for about a month but have worsened in the last few days.  Will check labs at visit today. Will wait for results to determine dose of medication patient needs.  Will make recommendations based on results.

## 2021-07-31 NOTE — Telephone Encounter (Signed)
° ° ° °  Chief Complaint: Dizziness Symptoms: Dizzy, headache, nausea Frequency: Started 1 year ago, but getting worse. Had to pull over yesterday while driving. Pertinent Negatives: Patient denies  Disposition: [] ED /[] Urgent Care (no appt availability in office) / [] Appointment(In office/virtual)/ []  [facility]/ [] Home Care/ [] Refused Recommended Disposition /[] [facility]/ []  Follow-up with PCP Additional Notes: Pt. Asking to be worked in today - called out sick from work. Employer said if she was out tomorrow "they would fire me." Please advise pt.  Answer Assessment - Initial Assessment Questions 1. DESCRIPTION: "Describe your dizziness."     Dizzy 2. LIGHTHEADED: "Do you feel lightheaded?" (e.g., somewhat faint, woozy, weak upon standing)     Woozy 3. VERTIGO: "Do you feel like either you or the room is spinning or tilting?" (i.e. vertigo)     No 4. SEVERITY: "How bad is it?"  "Do you feel like you are going to faint?" "Can you stand and walk?"   - MILD: Feels slightly dizzy, but walking normally.   - MODERATE: Feels unsteady when walking, but not falling; interferes with normal activities (e.g., school, work).   - SEVERE: Unable to walk without falling, or requires assistance to walk without falling; feels like passing out now.      Moderate 5. ONSET:  "When did the dizziness begin?"     1 year ago but is worse 6. AGGRAVATING FACTORS: "Does anything make it worse?" (e.g., standing, change in head position)     Walking 7. HEART RATE: "Can you tell me your heart rate?" "How many beats in 15 seconds?"  (Note: not all patients can do this)       No 8. CAUSE: "What do you think is causing the dizziness?"     Unsure 9. RECURRENT SYMPTOM: "Have you had dizziness before?" If Yes, ask: "When was the last time?" "What happened that time?"     No 10. OTHER SYMPTOMS: "Do you have any other symptoms?" (e.g., fever, chest pain, vomiting, diarrhea, bleeding)        Nausea 11. PREGNANCY: "Is there any chance you are pregnant?" "When was your last menstrual period?"       No  Protocols used: Dizziness - Lightheadedness-A-AH

## 2021-07-31 NOTE — Assessment & Plan Note (Signed)
Labs ordered today.  Will make recommendations based on lab results. ?

## 2021-08-01 LAB — COMPREHENSIVE METABOLIC PANEL
ALT: 18 IU/L (ref 0–32)
AST: 12 IU/L (ref 0–40)
Albumin/Globulin Ratio: 1.4 (ref 1.2–2.2)
Albumin: 4.2 g/dL (ref 3.9–5.0)
Alkaline Phosphatase: 79 IU/L (ref 44–121)
BUN/Creatinine Ratio: 16 (ref 9–23)
BUN: 13 mg/dL (ref 6–20)
Bilirubin Total: <0.2 mg/dL (ref 0.0–1.2)
CO2: 24 mmol/L (ref 20–29)
Calcium: 9.7 mg/dL (ref 8.7–10.2)
Chloride: 100 mmol/L (ref 96–106)
Creatinine, Ser: 0.82 mg/dL (ref 0.57–1.00)
Globulin, Total: 2.9 g/dL (ref 1.5–4.5)
Glucose: 81 mg/dL (ref 70–99)
Potassium: 4.4 mmol/L (ref 3.5–5.2)
Sodium: 140 mmol/L (ref 134–144)
Total Protein: 7.1 g/dL (ref 6.0–8.5)
eGFR: 100 mL/min/{1.73_m2} (ref >59)

## 2021-08-01 LAB — CBC WITH DIFFERENTIAL/PLATELET
Basophils Absolute: 0 10*3/uL (ref 0.0–0.2)
Basos: 0 %
EOS (ABSOLUTE): 0.1 10*3/uL (ref 0.0–0.4)
Eos: 1 %
Hematocrit: 40.2 % (ref 34.0–46.6)
Hemoglobin: 13.6 g/dL (ref 11.1–15.9)
Immature Grans (Abs): 0 10*3/uL (ref 0.0–0.1)
Immature Granulocytes: 0 %
Lymphocytes Absolute: 2.2 10*3/uL (ref 0.7–3.1)
Lymphs: 31 %
MCH: 28.8 pg (ref 26.6–33.0)
MCHC: 33.8 g/dL (ref 31.5–35.7)
MCV: 85 fL (ref 79–97)
Monocytes Absolute: 0.4 10*3/uL (ref 0.1–0.9)
Monocytes: 6 %
Neutrophils Absolute: 4.3 10*3/uL (ref 1.4–7.0)
Neutrophils: 62 %
Platelets: 267 10*3/uL (ref 150–450)
RBC: 4.73 x10E6/uL (ref 3.77–5.28)
RDW: 13.7 % (ref 11.7–15.4)
WBC: 7.1 10*3/uL (ref 3.4–10.8)

## 2021-08-01 LAB — VITAMIN D 25 HYDROXY (VIT D DEFICIENCY, FRACTURES): Vit D, 25-Hydroxy: 16.7 ng/mL — ABNORMAL LOW (ref 30.0–100.0)

## 2021-08-01 LAB — T4, FREE: Free T4: 0.76 ng/dL — ABNORMAL LOW (ref 0.82–1.77)

## 2021-08-01 LAB — HEMOGLOBIN A1C
Est. average glucose Bld gHb Est-mCnc: 114 mg/dL
Hgb A1c MFr Bld: 5.6 % (ref 4.8–5.6)

## 2021-08-01 LAB — TSH: TSH: 2.84 u[IU]/mL (ref 0.450–4.500)

## 2021-08-01 MED ORDER — CHOLECALCIFEROL 1.25 MG (50000 UT) PO TABS: 1.0000 | ORAL_TABLET | ORAL | 0 refills | Status: DC

## 2021-08-01 NOTE — Progress Notes (Signed)
Please let patient know that I do not think we should restart the Levothyroxine.  Her TSH is within normal limites and her T4 is very slightly abnormal.  Her vitamin D is low and could be contributing to her symptoms.  I also think stopping the Effexor and Synthroid abruptly at the same time could have contributed to the symptoms.  I have sent in Vitamin D 50,000 IU to be taken once weekly.  I'd like to see her back in 1 month to see how her symptoms are.  Please make her an appt.

## 2021-08-01 NOTE — Addendum Note (Signed)
Addended by: Jon Billings on: 08/01/2021 10:19 AM   Modules accepted: Orders

## 2021-08-02 ENCOUNTER — Encounter: Payer: Self-pay | Admitting: Nurse Practitioner

## 2021-08-06 NOTE — Telephone Encounter (Signed)
Lmom asking pt to call back to schedule an appt. °

## 2021-09-19 ENCOUNTER — Ambulatory Visit (INDEPENDENT_AMBULATORY_CARE_PROVIDER_SITE_OTHER): Payer: 59 | Admitting: Nurse Practitioner

## 2021-09-19 ENCOUNTER — Encounter: Payer: Self-pay | Admitting: Nurse Practitioner

## 2021-09-19 VITALS — BP 122/71 | HR 74 | Temp 98.6°F | Ht 68.0 in | Wt 231.8 lb

## 2021-09-19 DIAGNOSIS — E559 Vitamin D deficiency, unspecified: Secondary | ICD-10-CM

## 2021-09-19 DIAGNOSIS — R21 Rash and other nonspecific skin eruption: Secondary | ICD-10-CM

## 2021-09-19 DIAGNOSIS — Z6835 Body mass index (BMI) 35.0-35.9, adult: Secondary | ICD-10-CM

## 2021-09-19 DIAGNOSIS — R5383 Other fatigue: Secondary | ICD-10-CM

## 2021-09-19 DIAGNOSIS — E669 Obesity, unspecified: Secondary | ICD-10-CM | POA: Insufficient documentation

## 2021-09-19 DIAGNOSIS — E039 Hypothyroidism, unspecified: Secondary | ICD-10-CM | POA: Diagnosis not present

## 2021-09-19 DIAGNOSIS — Z111 Encounter for screening for respiratory tuberculosis: Secondary | ICD-10-CM

## 2021-09-19 DIAGNOSIS — E6609 Other obesity due to excess calories: Secondary | ICD-10-CM

## 2021-09-19 DIAGNOSIS — Z1159 Encounter for screening for other viral diseases: Secondary | ICD-10-CM

## 2021-09-19 MED ORDER — CHOLECALCIFEROL 1.25 MG (50000 UT) PO TABS: 1.0000 | ORAL_TABLET | ORAL | 0 refills | Status: AC

## 2021-09-19 MED ORDER — LEVOTHYROXINE SODIUM 25 MCG PO TABS: 25.0000 ug | ORAL_TABLET | Freq: Every day | ORAL | 3 refills | Status: DC

## 2021-09-19 MED ORDER — CICLOPIROX OLAMINE 0.77 % EX CREA: TOPICAL_CREAM | Freq: Two times a day (BID) | CUTANEOUS | 0 refills | Status: DC

## 2021-09-19 MED ORDER — CICLOPIROX 1 % EX SHAM: 1.0000 "application " | MEDICATED_SHAMPOO | Freq: Every day | CUTANEOUS | 0 refills | Status: DC

## 2021-09-19 NOTE — Assessment & Plan Note (Signed)
BMI 35.25.  Recommended eating smaller high protein, low fat meals more frequently and exercising 30 mins a day 5 times a week with a goal of 10-15lb weight loss in the next 3 months. Patient voiced their understanding and motivation to adhere to these recommendations. ? ?

## 2021-09-19 NOTE — Patient Instructions (Signed)

## 2021-09-19 NOTE — Progress Notes (Signed)
? ?BP 122/71   Pulse 74   Temp 98.6 ?F (37 ?C)   Ht '5\' 8"'  (1.727 m)   Wt 231 lb 12.8 oz (105.1 kg)   LMP 11/14/2020   SpO2 100%   BMI 35.25 kg/m?   ? ?Subjective:  ? ? Patient ID: Donna Cox, female    DOB: Jan 28, 1991, 31 y.o.   MRN: [id number] ? ?HPI: ?Donna Cox is a 31 y.o. female ? ?Chief Complaint  ?Patient presents with  ? Skin Problem  ?  Patient is here she noticed spots on her neck. Patient states she was recently seen here in the office back in February. Patient states every since she left her appointment she started noticing spots on her neck. Patient states she is concerned about the way they are spreading and how fast. Patient denies having any itching in the area.   ? Screening for Work  ? Fatigue  ? Labs Only  ?  Patient states she would like to have her Vitamin D levels checked. Patient states the prescription for her vitamin D she has completed. Patient would like to discuss at her visit today.  ? ?Needs forms signed for [facility] -- needs TB labs. ? ?SKIN ISSUES ?Has noticed spots to front of neck, appeared a couple days after her recent appointment -- this has multiplied and double every day to neck since appointment.  They start off small and then increase in size.   ?Duration: weeks ?Location: neck area ?Painful: no ?Itching: no ?Onset: gradual ?Context: changing ?Associated signs and symptoms:  ?History of skin cancer: no ?History of precancerous skin lesions: no ?Family history of skin cancer: no  ? ?FATIGUE ?Has neem dealing with fatigue for a long period.  Used to have medicine for thyroid -- this helped a lot with fatigue when she was on it.  Last levels were stable without medication.  She stopped taking Levothyroxine after losing insurance in July 2022.  Has noticed increased fatigue since stopping Levothyroxine.  Taking Vitamin D weekly at home.  ?Duration:  chronic ?Severity: 10/10 == with Levothyroxine on board was a 4 ?Onset: gradual ?Context when symptoms started:  none ?Symptoms  improve with rest: no  ?Depressive symptoms: no ?Stress/anxiety: no ?Insomnia: yes hard to stay asleep ?Snoring: yes ?Observed apnea by bed partner: no ?Daytime hypersomnolence:no ?Wakes feeling refreshed: no ?History of sleep study: no ?Dysnea on exertion:  no ?Orthopnea/PND: no ?Chest pain: no ?Chronic cough: no ?Lower extremity edema: no ?Arthralgias:no ?Myalgias: no ?Weakness: no ?Rash: no  ? ?Relevant past medical, surgical, family and social history reviewed and updated as indicated. Interim medical history since our last visit reviewed. ?Allergies and medications reviewed and updated. ? ?Review of Systems  ?Constitutional:  Positive for fatigue. Negative for activity change, appetite change, diaphoresis and fever.  ?Respiratory:  Negative for cough, chest tightness and shortness of breath.   ?Cardiovascular:  Negative for chest pain, palpitations and leg swelling.  ?Gastrointestinal: Negative.   ?Skin:  Positive for rash.  ?Neurological: Negative.   ?Psychiatric/Behavioral: Negative.    ? ?Per HPI unless specifically indicated above ? ?   ?Objective:  ?  ?BP 122/71   Pulse 74   Temp 98.6 ?F (37 ?C)   Ht '5\' 8"'  (1.727 m)   Wt 231 lb 12.8 oz (105.1 kg)   LMP 11/14/2020   SpO2 100%   BMI 35.25 kg/m?   ?Wt Readings from Last 3 Encounters:  ?09/19/21 231 lb 12.8 oz (105.1 kg)  ?07/31/21 223 lb  6.4 oz (101.3 kg)  ?01/17/21 228 lb (103.4 kg)  ?  ?Physical Exam ?Vitals and nursing note reviewed.  ?Constitutional:   ?   General: She is awake. She is not in acute distress. ?   Appearance: She is well-developed and well-groomed. She is obese. She is not ill-appearing or toxic-appearing.  ?HENT:  ?   Head: Normocephalic.  ?   Right Ear: Hearing, tympanic membrane, ear canal and external ear normal.  ?   Left Ear: Hearing, tympanic membrane, ear canal and external ear normal.  ?   Nose: Nose normal.  ?   Mouth/Throat:  ?   Mouth: Mucous membranes are moist.  ?   Pharynx: Oropharynx is clear.  ?   Comments:  Mallampati Class 3-4 on exam. ?Eyes:  ?   General: Lids are normal.     ?   Right eye: No discharge.     ?   Left eye: No discharge.  ?   Conjunctiva/sclera: Conjunctivae normal.  ?   Pupils: Pupils are equal, round, and reactive to light.  ?Neck:  ?   Thyroid: Thyromegaly (mild, no nodules) present.  ?   Vascular: No carotid bruit.  ?Cardiovascular:  ?   Rate and Rhythm: Normal rate and regular rhythm.  ?   Heart sounds: Normal heart sounds. No murmur heard. ?  No gallop.  ?Pulmonary:  ?   Effort: Pulmonary effort is normal. No accessory muscle usage or respiratory distress.  ?   Breath sounds: Normal breath sounds.  ?Abdominal:  ?   General: Bowel sounds are normal.  ?   Palpations: Abdomen is soft. There is no hepatomegaly or splenomegaly.  ?Musculoskeletal:  ?   Cervical back: Normal range of motion and neck supple.  ?   Right lower leg: No edema.  ?   Left lower leg: No edema.  ?Lymphadenopathy:  ?   Cervical: No cervical adenopathy.  ?Skin: ?   General: Skin is warm and dry.  ?   Findings: Rash present.  ?   Comments: To neck x 5 approx 1-2 cm flat, round tan lesions (tinea in appearance) with deeper tan external aspect and pale internal aspect.  Mild scaling around external aspect.  ?Neurological:  ?   Mental Status: She is alert and oriented to person, place, and time.  ?Psychiatric:     ?   Attention and Perception: Attention normal.     ?   Mood and Affect: Mood normal.     ?   Speech: Speech normal.     ?   Behavior: Behavior normal. Behavior is cooperative.     ?   Thought Content: Thought content normal.  ? ?Results for orders placed or performed in visit on 07/31/21  ?Comp Met (CMET)  ?Result Value Ref Range  ? Glucose 81 70 - 99 mg/dL  ? BUN 13 6 - 20 mg/dL  ? Creatinine, Ser 0.82 0.57 - 1.00 mg/dL  ? eGFR 100 >59 mL/min/1.73  ? BUN/Creatinine Ratio 16 9 - 23  ? Sodium 140 134 - 144 mmol/L  ? Potassium 4.4 3.5 - 5.2 mmol/L  ? Chloride 100 96 - 106 mmol/L  ? CO2 24 20 - 29 mmol/L  ? Calcium 9.7 8.7 -  10.2 mg/dL  ? Total Protein 7.1 6.0 - 8.5 g/dL  ? Albumin 4.2 3.9 - 5.0 g/dL  ? Globulin, Total 2.9 1.5 - 4.5 g/dL  ? Albumin/Globulin Ratio 1.4 1.2 - 2.2  ? Bilirubin Total <0.2  0.0 - 1.2 mg/dL  ? Alkaline Phosphatase 79 44 - 121 IU/L  ? AST 12 0 - 40 IU/L  ? ALT 18 0 - 32 IU/L  ?HgB A1c  ?Result Value Ref Range  ? Hgb A1c MFr Bld 5.6 4.8 - 5.6 %  ? Est. average glucose Bld gHb Est-mCnc 114 mg/dL  ?CBC w/Diff  ?Result Value Ref Range  ? WBC 7.1 3.4 - 10.8 x10E3/uL  ? RBC 4.73 3.77 - 5.28 x10E6/uL  ? Hemoglobin 13.6 11.1 - 15.9 g/dL  ? Hematocrit 40.2 34.0 - 46.6 %  ? MCV 85 79 - 97 fL  ? MCH 28.8 26.6 - 33.0 pg  ? MCHC 33.8 31.5 - 35.7 g/dL  ? RDW 13.7 11.7 - 15.4 %  ? Platelets 267 150 - 450 x10E3/uL  ? Neutrophils 62 Not Estab. %  ? Lymphs 31 Not Estab. %  ? Monocytes 6 Not Estab. %  ? Eos 1 Not Estab. %  ? Basos 0 Not Estab. %  ? Neutrophils Absolute 4.3 1.4 - 7.0 x10E3/uL  ? Lymphocytes Absolute 2.2 0.7 - 3.1 x10E3/uL  ? Monocytes Absolute 0.4 0.1 - 0.9 x10E3/uL  ? EOS (ABSOLUTE) 0.1 0.0 - 0.4 x10E3/uL  ? Basophils Absolute 0.0 0.0 - 0.2 x10E3/uL  ? Immature Granulocytes 0 Not Estab. %  ? Immature Grans (Abs) 0.0 0.0 - 0.1 x10E3/uL  ?TSH  ?Result Value Ref Range  ? TSH 2.840 0.450 - 4.500 uIU/mL  ?T4, free  ?Result Value Ref Range  ? Free T4 0.76 (L) 0.82 - 1.77 ng/dL  ?Vitamin D (25 hydroxy)  ?Result Value Ref Range  ? Vit D, 25-Hydroxy 16.7 (L) 30.0 - 100.0 ng/mL  ? ?   ?Assessment & Plan:  ? ?Problem List Items Addressed This Visit   ? ?  ? Endocrine  ? Hypothyroid - Primary  ?  Ongoing, off medication at this time with recent labs noting normal TSH, but low Free T4.  Patient continues with fatigue since stopping Levothyroxine, suspect underlying thyroid disease and would benefit from maintaining on medication.  Will check TSH, Free T4, and Thyroid antibody today + obtain thyroid ultrasound due to mild thyromegaly on exam.  Restart Levothyroxine at 25 MCG and adjust as needed.  Discussed at length with  patient.  Recheck thyroid labs in 6 weeks. ?  ?  ? Relevant Medications  ? levothyroxine (SYNTHROID) 25 MCG tablet  ? Other Relevant Orders  ? T4, free  ? Thyroid peroxidase antibody  ? TSH  ? CBC with Differential/Platelet  ? Co

## 2021-09-19 NOTE — Assessment & Plan Note (Signed)
Ongoing, off medication at this time with recent labs noting normal TSH, but low Free T4.  Patient continues with fatigue since stopping Levothyroxine, suspect underlying thyroid disease and would benefit from maintaining on medication.  Will check TSH, Free T4, and Thyroid antibody today + obtain thyroid ultrasound due to mild thyromegaly on exam.  Restart Levothyroxine at 25 MCG and adjust as needed.  Discussed at length with patient.  Recheck thyroid labs in 6 weeks. ?

## 2021-09-19 NOTE — Assessment & Plan Note (Addendum)
Ongoing since stopping Levothyroxine, suspect this is thyroid related -- refer to hypothyroid plan of care.  Check CBC and CMP today.  Discussed with her she would benefit from a sleep study due to findings on exam and discussion with patient, suspect some level of sleep apnea present.  She wishes to look into this, in future consider referral to [facility] for sleep study. ?

## 2021-09-19 NOTE — Assessment & Plan Note (Signed)
To anterior neck, tinea in appearance.  Will send in Ciclopirox shampoo and cream to treat.  Plan for return in 2 weeks to recheck. ?

## 2021-09-19 NOTE — Assessment & Plan Note (Signed)
Ongoing, continues on supplement, recheck level today and adjust as needed. ?

## 2021-09-20 NOTE — Progress Notes (Signed)
Contacted via [facility] ? ? ?Good day Donna Cox, your labs have returned: ?- Thyroid labs continue to show a lower Free T4 with normal TSH, although this is creeping up a little from recent check.  Antibody is normal.  Please ensure to obtain thyroid ultrasound and start the Levothyroxine as we discussed due to symptoms. ?- Kidney function, creatinine and eGFR, remains normal, as is liver function, AST and ALT.  Sodium, salt, level is a little low at 133 -- add a little tablet salt daily to meals. ?- Vitamin D remains on low side, please ensure to take weekly supplement. ?- CBC show no anemia or infection. ?- Hep C is negative ?- Waiting on TB testing and will alert you when this returns.  Any questions? ?Keep being stellar!!  Thank you for allowing me to participate in your care.  I appreciate you. ?Kindest regards, ?Nayanna Seaborn ?

## 2021-09-23 LAB — COMPREHENSIVE METABOLIC PANEL
ALT: 16 IU/L (ref 0–32)
AST: 13 IU/L (ref 0–40)
Albumin/Globulin Ratio: 1.6 (ref 1.2–2.2)
Albumin: 4.1 g/dL (ref 3.9–5.0)
Alkaline Phosphatase: 79 IU/L (ref 44–121)
BUN/Creatinine Ratio: 15 (ref 9–23)
BUN: 11 mg/dL (ref 6–20)
Bilirubin Total: <0.2 mg/dL (ref 0.0–1.2)
CO2: 24 mmol/L (ref 20–29)
Calcium: 9.2 mg/dL (ref 8.7–10.2)
Chloride: 98 mmol/L (ref 96–106)
Creatinine, Ser: 0.71 mg/dL (ref 0.57–1.00)
Globulin, Total: 2.5 g/dL (ref 1.5–4.5)
Glucose: 81 mg/dL (ref 70–99)
Potassium: 4.3 mmol/L (ref 3.5–5.2)
Sodium: 133 mmol/L — ABNORMAL LOW (ref 134–144)
Total Protein: 6.6 g/dL (ref 6.0–8.5)
eGFR: 119 mL/min/{1.73_m2} (ref >59)

## 2021-09-23 LAB — QUANTIFERON-TB GOLD PLUS
QuantiFERON Mitogen Value: >10 IU/mL
QuantiFERON Nil Value: 0.29 IU/mL
QuantiFERON TB1 Ag Value: 0.35 IU/mL
QuantiFERON TB2 Ag Value: 0.43 IU/mL
QuantiFERON-TB Gold Plus: NEGATIVE

## 2021-09-23 LAB — CBC WITH DIFFERENTIAL/PLATELET
Basophils Absolute: 0 10*3/uL (ref 0.0–0.2)
Basos: 1 %
EOS (ABSOLUTE): 0.1 10*3/uL (ref 0.0–0.4)
Eos: 1 %
Hematocrit: 38.5 % (ref 34.0–46.6)
Hemoglobin: 13.1 g/dL (ref 11.1–15.9)
Immature Grans (Abs): 0 10*3/uL (ref 0.0–0.1)
Immature Granulocytes: 0 %
Lymphocytes Absolute: 2.2 10*3/uL (ref 0.7–3.1)
Lymphs: 26 %
MCH: 28.7 pg (ref 26.6–33.0)
MCHC: 34 g/dL (ref 31.5–35.7)
MCV: 84 fL (ref 79–97)
Monocytes Absolute: 0.5 10*3/uL (ref 0.1–0.9)
Monocytes: 5 %
Neutrophils Absolute: 5.8 10*3/uL (ref 1.4–7.0)
Neutrophils: 67 %
Platelets: 224 10*3/uL (ref 150–450)
RBC: 4.56 x10E6/uL (ref 3.77–5.28)
RDW: 13.6 % (ref 11.7–15.4)
WBC: 8.6 10*3/uL (ref 3.4–10.8)

## 2021-09-23 LAB — TSH: TSH: 3.55 u[IU]/mL (ref 0.450–4.500)

## 2021-09-23 LAB — T4, FREE: Free T4: 0.8 ng/dL — ABNORMAL LOW (ref 0.82–1.77)

## 2021-09-23 LAB — VITAMIN D 25 HYDROXY (VIT D DEFICIENCY, FRACTURES): Vit D, 25-Hydroxy: 17.9 ng/mL — ABNORMAL LOW (ref 30.0–100.0)

## 2021-09-23 LAB — THYROID PEROXIDASE ANTIBODY: Thyroperoxidase Ab SerPl-aCnc: <9 IU/mL (ref 0–34)

## 2021-09-23 LAB — HEPATITIS C ANTIBODY: Hep C Virus Ab: NONREACTIVE

## 2021-09-24 ENCOUNTER — Ambulatory Visit: Payer: 59

## 2021-09-28 NOTE — Patient Instructions (Signed)

## 2021-10-03 ENCOUNTER — Ambulatory Visit (INDEPENDENT_AMBULATORY_CARE_PROVIDER_SITE_OTHER): Payer: 59 | Admitting: Nurse Practitioner

## 2021-10-03 ENCOUNTER — Encounter: Payer: Self-pay | Admitting: Nurse Practitioner

## 2021-10-03 VITALS — BP 127/80 | HR 62 | Temp 97.8°F | Ht 67.99 in | Wt 228.0 lb

## 2021-10-03 DIAGNOSIS — R0683 Snoring: Secondary | ICD-10-CM

## 2021-10-03 DIAGNOSIS — R5383 Other fatigue: Secondary | ICD-10-CM

## 2021-10-03 DIAGNOSIS — E6609 Other obesity due to excess calories: Secondary | ICD-10-CM

## 2021-10-03 DIAGNOSIS — R21 Rash and other nonspecific skin eruption: Secondary | ICD-10-CM | POA: Diagnosis not present

## 2021-10-03 DIAGNOSIS — E039 Hypothyroidism, unspecified: Secondary | ICD-10-CM

## 2021-10-03 DIAGNOSIS — Z6834 Body mass index (BMI) 34.0-34.9, adult: Secondary | ICD-10-CM

## 2021-10-03 MED ORDER — CICLOPIROX 1 % EX SHAM: 1.0000 "application " | MEDICATED_SHAMPOO | Freq: Every day | CUTANEOUS | 3 refills | Status: AC

## 2021-10-03 MED ORDER — WEGOVY 0.25 MG/0.5ML ~~LOC~~ SOAJ: 0.2500 mg | SUBCUTANEOUS | 1 refills | Status: DC

## 2021-10-03 MED ORDER — CICLOPIROX OLAMINE 0.77 % EX CREA: TOPICAL_CREAM | Freq: Two times a day (BID) | CUTANEOUS | 3 refills | Status: AC

## 2021-10-03 NOTE — Progress Notes (Signed)
? ?BP 127/80   Pulse 62   Temp 97.8 ?F (36.6 ?C) (Oral)   Ht 5' 7.99" (1.727 m)   Wt 228 lb (103.4 kg)   LMP 11/14/2020   SpO2 99%   BMI 34.68 kg/m?   ? ?Subjective:  ? ? Patient ID: Donna Cox, female    DOB: 1990/07/16, 31 y.o.   MRN: [id number] ? ?HPI: ?Donna Cox is a 31 y.o. female ? ?Chief Complaint  ?Patient presents with  ? Hypothyroidism  ? Rash  ? ?WEIGHT LOSS ?She has been trying to lose weight over past 6 months -- coaches baseball, is active regularly and has cut out carbs with only 3 pounds loss.  Drinks only water.  No sodas.  Everyone in her family is overweight and has had gastric bypass.  No family or personal history of thyroid cancer or pancreatitis.  Her goal is to lose 60 pounds.  Not currently pregnant, history of hysterectomy. ? ?HYPOTHYROIDISM ?Started on Levothyroxine 25 MCG and is to go for ultrasound.   ?Thyroid control status:controlled ?Satisfied with current treatment? yes ?Medication side effects: no ?Medication compliance: good compliance ?Etiology of hypothyroidism:  ?Recent dose adjustment:no ?Fatigue: yes -- ongoing ?Cold intolerance: no ?Heat intolerance: yes ?Weight gain: no ?Weight loss: no ?Constipation: no ?Diarrhea/loose stools: no ?Palpitations: occasional flutters where races and takes her breath, going on for a few years. ?Lower extremity edema: no ?Anxiety/depressed mood: no  ? ?SKIN ISSUES ?Started on Ciclopirox shampoo and cream last visit.  This is fading in color and no new spots showing.   ?Duration: weeks ?Location: neck area ?Painful: no ?Itching: no ?Onset: gradual ?Context: changing ?Associated signs and symptoms:  ?History of skin cancer: no ?History of precancerous skin lesions: no ?Family history of skin cancer: no  ?  ?Relevant past medical, surgical, family and social history reviewed and updated as indicated. Interim medical history since our last visit reviewed. ?Allergies and medications reviewed and updated. ? ?Review of Systems   ?Constitutional:  Positive for fatigue. Negative for activity change, appetite change, diaphoresis and fever.  ?Respiratory:  Negative for cough, chest tightness and shortness of breath.   ?Cardiovascular:  Negative for chest pain, palpitations and leg swelling.  ?Gastrointestinal: Negative.   ?Skin:  Positive for rash.  ?Neurological: Negative.   ?Psychiatric/Behavioral: Negative.    ? ?Per HPI unless specifically indicated above ? ?   ?Objective:  ?  ?BP 127/80   Pulse 62   Temp 97.8 ?F (36.6 ?C) (Oral)   Ht 5' 7.99" (1.727 m)   Wt 228 lb (103.4 kg)   LMP 11/14/2020   SpO2 99%   BMI 34.68 kg/m?   ?Wt Readings from Last 3 Encounters:  ?10/03/21 228 lb (103.4 kg)  ?09/19/21 231 lb 12.8 oz (105.1 kg)  ?07/31/21 223 lb 6.4 oz (101.3 kg)  ?  ?Physical Exam ?Vitals and nursing note reviewed.  ?Constitutional:   ?   General: She is awake. She is not in acute distress. ?   Appearance: She is well-developed and well-groomed. She is obese. She is not ill-appearing or toxic-appearing.  ?HENT:  ?   Head: Normocephalic.  ?   Right Ear: Hearing, tympanic membrane, ear canal and external ear normal.  ?   Left Ear: Hearing, tympanic membrane, ear canal and external ear normal.  ?   Nose: Nose normal.  ?   Mouth/Throat:  ?   Mouth: Mucous membranes are moist.  ?   Pharynx: Oropharynx is clear.  ?  Comments: Mallampati Class 3-4 on exam. ?Eyes:  ?   General: Lids are normal.     ?   Right eye: No discharge.     ?   Left eye: No discharge.  ?   Conjunctiva/sclera: Conjunctivae normal.  ?   Pupils: Pupils are equal, round, and reactive to light.  ?Neck:  ?   Thyroid: No thyromegaly.  ?   Vascular: No carotid bruit.  ?Cardiovascular:  ?   Rate and Rhythm: Normal rate and regular rhythm.  ?   Heart sounds: Normal heart sounds. No murmur heard. ?  No gallop.  ?Pulmonary:  ?   Effort: Pulmonary effort is normal. No accessory muscle usage or respiratory distress.  ?   Breath sounds: Normal breath sounds.  ?Abdominal:  ?    General: Bowel sounds are normal.  ?   Palpations: Abdomen is soft. There is no hepatomegaly or splenomegaly.  ?Musculoskeletal:  ?   Cervical back: Normal range of motion and neck supple.  ?   Right lower leg: No edema.  ?   Left lower leg: No edema.  ?Lymphadenopathy:  ?   Cervical: No cervical adenopathy.  ?Skin: ?   General: Skin is warm and dry.  ?   Findings: Rash present.  ?   Comments: To neck x 4 approx 1-2 cm flat, round tan lesions (tinea in appearance) fading in color from previous exam and no new areas.  Scaling improved.  ?Neurological:  ?   Mental Status: She is alert and oriented to person, place, and time.  ?Psychiatric:     ?   Attention and Perception: Attention normal.     ?   Mood and Affect: Mood normal.     ?   Speech: Speech normal.     ?   Behavior: Behavior normal. Behavior is cooperative.     ?   Thought Content: Thought content normal.  ? ? ?Results for orders placed or performed in visit on 09/19/21  ?QuantiFERON-TB Gold Plus  ?Result Value Ref Range  ? QuantiFERON Incubation Incubation performed.   ? QuantiFERON Criteria Comment   ? QuantiFERON TB1 Ag Value 0.35 IU/mL  ? QuantiFERON TB2 Ag Value 0.43 IU/mL  ? QuantiFERON Nil Value 0.29 IU/mL  ? QuantiFERON Mitogen Value >10.00 IU/mL  ? QuantiFERON-TB Gold Plus Negative Negative  ?Hepatitis C antibody  ?Result Value Ref Range  ? Hep C Virus Ab Non Reactive Non Reactive  ?T4, free  ?Result Value Ref Range  ? Free T4 0.80 (L) 0.82 - 1.77 ng/dL  ?Thyroid peroxidase antibody  ?Result Value Ref Range  ? Thyroperoxidase Ab SerPl-aCnc <9 0 - 34 IU/mL  ?TSH  ?Result Value Ref Range  ? TSH 3.550 0.450 - 4.500 uIU/mL  ?CBC with Differential/Platelet  ?Result Value Ref Range  ? WBC 8.6 3.4 - 10.8 x10E3/uL  ? RBC 4.56 3.77 - 5.28 x10E6/uL  ? Hemoglobin 13.1 11.1 - 15.9 g/dL  ? Hematocrit 38.5 34.0 - 46.6 %  ? MCV 84 79 - 97 fL  ? MCH 28.7 26.6 - 33.0 pg  ? MCHC 34.0 31.5 - 35.7 g/dL  ? RDW 13.6 11.7 - 15.4 %  ? Platelets 224 150 - 450 x10E3/uL  ?  Neutrophils 67 Not Estab. %  ? Lymphs 26 Not Estab. %  ? Monocytes 5 Not Estab. %  ? Eos 1 Not Estab. %  ? Basos 1 Not Estab. %  ? Neutrophils Absolute 5.8 1.4 - 7.0 x10E3/uL  ?  Lymphocytes Absolute 2.2 0.7 - 3.1 x10E3/uL  ? Monocytes Absolute 0.5 0.1 - 0.9 x10E3/uL  ? EOS (ABSOLUTE) 0.1 0.0 - 0.4 x10E3/uL  ? Basophils Absolute 0.0 0.0 - 0.2 x10E3/uL  ? Immature Granulocytes 0 Not Estab. %  ? Immature Grans (Abs) 0.0 0.0 - 0.1 x10E3/uL  ?Comprehensive metabolic panel  ?Result Value Ref Range  ? Glucose 81 70 - 99 mg/dL  ? BUN 11 6 - 20 mg/dL  ? Creatinine, Ser 0.71 0.57 - 1.00 mg/dL  ? eGFR 119 >59 mL/min/1.73  ? BUN/Creatinine Ratio 15 9 - 23  ? Sodium 133 (L) 134 - 144 mmol/L  ? Potassium 4.3 3.5 - 5.2 mmol/L  ? Chloride 98 96 - 106 mmol/L  ? CO2 24 20 - 29 mmol/L  ? Calcium 9.2 8.7 - 10.2 mg/dL  ? Total Protein 6.6 6.0 - 8.5 g/dL  ? Albumin 4.1 3.9 - 5.0 g/dL  ? Globulin, Total 2.5 1.5 - 4.5 g/dL  ? Albumin/Globulin Ratio 1.6 1.2 - 2.2  ? Bilirubin Total <0.2 0.0 - 1.2 mg/dL  ? Alkaline Phosphatase 79 44 - 121 IU/L  ? AST 13 0 - 40 IU/L  ? ALT 16 0 - 32 IU/L  ?VITAMIN D 25 Hydroxy (Vit-D Deficiency, Fractures)  ?Result Value Ref Range  ? Vit D, 25-Hydroxy 17.9 (L) 30.0 - 100.0 ng/mL  ? ?   ?Assessment & Plan:  ? ?Problem List Items Addressed This Visit   ? ?  ? Endocrine  ? Hypothyroid - Primary  ?  Ongoing, restarted medication last visit due to ongoing low Free T4 and normal TSH with symptoms.  She is obtaining ultrasound upcoming.  At this time continue current Levothyroxine dosing and recheck labs today.   ?  ?  ? Relevant Orders  ? T4, free  ? TSH  ?  ? Musculoskeletal and Integument  ? Rash  ?  To anterior neck, tinea in appearance and improving.  Will continue Ciclopirox shampoo and cream to treat.   ?  ?  ?  ? Other  ? Fatigue  ?  Ongoing issue, will obtain sleep study, suspect some OSA present. ?  ?  ? Relevant Orders  ? Ambulatory referral to Sleep Studies  ? Obesity  ?  BMI 35.25.  Discussed at length  options for weight loss treatment, including no treatment.  At this time she would like to try [facility], educated her at length on this medication.  Script sent for 0.25 MG dosing and will monitor weight, increase in steps.  No history of th

## 2021-10-03 NOTE — Assessment & Plan Note (Signed)
To anterior neck, tinea in appearance and improving.  Will continue Ciclopirox shampoo and cream to treat.   ?

## 2021-10-03 NOTE — Assessment & Plan Note (Signed)
Ongoing, restarted medication last visit due to ongoing low Free T4 and normal TSH with symptoms.  She is obtaining ultrasound upcoming.  At this time continue current Levothyroxine dosing and recheck labs today.   ?

## 2021-10-03 NOTE — Assessment & Plan Note (Signed)
Ongoing issue, will obtain sleep study, suspect some OSA present. ?

## 2021-10-03 NOTE — Assessment & Plan Note (Addendum)
BMI 35.25.  Discussed at length options for weight loss treatment, including no treatment.  At this time she would like to try [facility], educated her at length on this medication.  Script sent for 0.25 MG dosing and will monitor weight, increase in steps.  No history of thyroid cancer in family or personal history pancreatitis.  Recommended eating smaller high protein, low fat meals more frequently and exercising 30 mins a day 5 times a week with a goal of 10-15lb weight loss in the next 3 months. Patient voiced their understanding and motivation to adhere to these recommendations. ? ?

## 2021-10-04 ENCOUNTER — Telehealth: Payer: Self-pay

## 2021-10-04 ENCOUNTER — Other Ambulatory Visit: Payer: Self-pay | Admitting: Nurse Practitioner

## 2021-10-04 LAB — TSH: TSH: 4.58 u[IU]/mL — ABNORMAL HIGH (ref 0.450–4.500)

## 2021-10-04 LAB — T4, FREE: Free T4: 0.86 ng/dL (ref 0.82–1.77)

## 2021-10-04 MED ORDER — LEVOTHYROXINE SODIUM 50 MCG PO TABS: 50.0000 ug | ORAL_TABLET | Freq: Every day | ORAL | 3 refills | Status: AC

## 2021-10-04 NOTE — Progress Notes (Signed)
Contacted via MyChart   ---- Needs PA for Wegovy added on yesterday please:) ? ? ?Good morning Donna Cox, your labs have returned.  Your TSH is mildly elevated this check but Free T4 is trending up a little.  At this time since you are still having fatigue and symptoms I recommend we go up to 50 MCG on your Levothyroxine.  Start taking 2 of your 25 MCG tablets daily and I will send in a 50 MCG pill to start once those are complete.  We will recheck labs at next visit.  Any questions? ?Keep being awesome!!  Thank you for allowing me to participate in your care.  I appreciate you. ?Kindest regards, ?Leighton Brickley

## 2021-10-04 NOTE — Telephone Encounter (Signed)
PA started for [facility] through Covermy meds. Awaiting on determination ? ?

## 2021-10-14 ENCOUNTER — Encounter: Payer: Self-pay | Admitting: Nurse Practitioner

## 2021-10-14 NOTE — Telephone Encounter (Signed)
Pt returned call. She has spoke with her insurance company. Per her insurance company we need to file an "exception to benefits" form which may be found on [employer] site. Most likely this medication would then be covered.  ?

## 2021-10-14 NOTE — Telephone Encounter (Signed)
Explanation of benefits form, letter, and OV note faxed in to [employer] for the patient. Will await determination.  ?

## 2021-10-14 NOTE — Telephone Encounter (Signed)
Searched for PA on Cover My Meds. PA was denied and stated to contact insurance company.  ? ?Call placed to [employer]. The representative I spoke with stated that this insurance does not cover any weight loss medications at all.  ? ?Is there something else or any suggestions for the patient? ?

## 2021-10-14 NOTE — Telephone Encounter (Signed)
Patient checking on the status of PA update ok to leave a detail message ?

## 2021-10-14 NOTE — Telephone Encounter (Signed)
Explanation of benefits form filled out. Jolene, would you like to send in a letter with this as well to see if coverage will be approved?  ?

## 2021-10-14 NOTE — Telephone Encounter (Signed)
Called and LVM asking for patient to please return my call.  ? ?OK for PEC to give patient Jolene's message regarding medication if she calls back.  ?

## 2021-10-16 NOTE — Telephone Encounter (Signed)
Called and LVM notifying patient that information has been sent and that we will let her know once we receive a determination.  ?

## 2021-11-14 ENCOUNTER — Institutional Professional Consult (permissible substitution): Payer: 59 | Admitting: Neurology

## 2021-11-14 ENCOUNTER — Ambulatory Visit (INDEPENDENT_AMBULATORY_CARE_PROVIDER_SITE_OTHER): Payer: 59 | Admitting: Nurse Practitioner

## 2021-11-14 ENCOUNTER — Encounter: Payer: Self-pay | Admitting: Nurse Practitioner

## 2021-11-14 ENCOUNTER — Encounter: Payer: Self-pay | Admitting: Neurology

## 2021-11-14 VITALS — BP 138/85 | HR 74 | Temp 97.8°F | Wt 223.6 lb

## 2021-11-14 DIAGNOSIS — R21 Rash and other nonspecific skin eruption: Secondary | ICD-10-CM | POA: Diagnosis not present

## 2021-11-14 DIAGNOSIS — E6609 Other obesity due to excess calories: Secondary | ICD-10-CM

## 2021-11-14 DIAGNOSIS — E039 Hypothyroidism, unspecified: Secondary | ICD-10-CM

## 2021-11-14 DIAGNOSIS — Z6834 Body mass index (BMI) 34.0-34.9, adult: Secondary | ICD-10-CM

## 2021-11-14 NOTE — Assessment & Plan Note (Signed)
Referral placed for patient to see Dermatology due to spots on her neck not improving with cream she was given at last visit.

## 2021-11-14 NOTE — Progress Notes (Signed)
BP 138/85   Pulse 74   Temp 97.8 F (36.6 C) (Oral)   Wt 223 lb 9.6 oz (101.4 kg)   LMP 11/14/2020   SpO2 97%   Breastfeeding No   BMI 34.01 kg/m    Subjective:    Patient ID: Donna Cox, female    DOB: 1991/04/12, 31 y.o.   MRN: [id number]  HPI: Donna Cox is a 31 y.o. female  Chief Complaint  Patient presents with   Weight Check    Pt reports unable to start wegovy d/t insurance not approving.    Fatigue    States she is doing better with the fatigue.    Follow-up   WEIGHT LOSS Patient states she wasn't able to get the [facility] due to insurance.  She is still working on weight loss without the medication.  HYPOTHYROIDISM She is till on Levothyroxine 25 MCG and her fatigue has been a lot better.  Thyroid control status:controlled Satisfied with current treatment? yes Medication side effects: no Medication compliance: good compliance Etiology of hypothyroidism:  Recent dose adjustment:no Fatigue: yes -- ongoing Cold intolerance: no Heat intolerance: yes Weight gain: no Weight loss: no Constipation: no Diarrhea/loose stools: no Palpitations: occasional flutters where races and takes her breath, going on for a few years. Lower extremity edema: no Anxiety/depressed mood: no      Relevant past medical, surgical, family and social history reviewed and updated as indicated. Interim medical history since our last visit reviewed. Allergies and medications reviewed and updated.  Review of Systems  Constitutional:  Negative for activity change, appetite change, diaphoresis, fatigue and fever.  Respiratory:  Negative for cough, chest tightness and shortness of breath.   Cardiovascular:  Negative for chest pain, palpitations and leg swelling.  Gastrointestinal: Negative.   Skin:  Positive for rash.  Neurological: Negative.   Psychiatric/Behavioral: Negative.     Per HPI unless specifically indicated above     Objective:    BP 138/85   Pulse 74   Temp 97.8 F  (36.6 C) (Oral)   Wt 223 lb 9.6 oz (101.4 kg)   LMP 11/14/2020   SpO2 97%   Breastfeeding No   BMI 34.01 kg/m   Wt Readings from Last 3 Encounters:  11/14/21 223 lb 9.6 oz (101.4 kg)  10/03/21 228 lb (103.4 kg)  09/19/21 231 lb 12.8 oz (105.1 kg)    Physical Exam Vitals and nursing note reviewed.  Constitutional:      General: She is awake. She is not in acute distress.    Appearance: She is well-developed and well-groomed. She is obese. She is not ill-appearing or toxic-appearing.  HENT:     Head: Normocephalic.     Right Ear: Hearing, tympanic membrane, ear canal and external ear normal.     Left Ear: Hearing, tympanic membrane, ear canal and external ear normal.     Nose: Nose normal.     Mouth/Throat:     Mouth: Mucous membranes are moist.     Pharynx: Oropharynx is clear.     Comments: Mallampati Class 3-4 on exam. Eyes:     General: Lids are normal.        Right eye: No discharge.        Left eye: No discharge.     Conjunctiva/sclera: Conjunctivae normal.     Pupils: Pupils are equal, round, and reactive to light.  Neck:     Thyroid: No thyromegaly.     Vascular: No carotid bruit.  Cardiovascular:  Rate and Rhythm: Normal rate and regular rhythm.     Heart sounds: Normal heart sounds. No murmur heard.   No gallop.  Pulmonary:     Effort: Pulmonary effort is normal. No accessory muscle usage or respiratory distress.     Breath sounds: Normal breath sounds.  Abdominal:     General: Bowel sounds are normal.     Palpations: Abdomen is soft. There is no hepatomegaly or splenomegaly.  Musculoskeletal:     Cervical back: Normal range of motion and neck supple.     Right lower leg: No edema.     Left lower leg: No edema.  Lymphadenopathy:     Cervical: No cervical adenopathy.  Skin:    General: Skin is warm and dry.     Findings: Rash present.     Comments: To neck x 4 approx 1-2 cm flat, round tan lesions (tinea in appearance) fading in color from previous  exam and no new areas.  Scaling improved.  Neurological:     Mental Status: She is alert and oriented to person, place, and time.  Psychiatric:        Attention and Perception: Attention normal.        Mood and Affect: Mood normal.        Speech: Speech normal.        Behavior: Behavior normal. Behavior is cooperative.        Thought Content: Thought content normal.    Results for orders placed or performed in visit on 10/03/21  T4, free  Result Value Ref Range   Free T4 0.86 0.82 - 1.77 ng/dL  TSH  Result Value Ref Range   TSH 4.580 (H) 0.450 - 4.500 uIU/mL      Assessment & Plan:   Problem List Items Addressed This Visit       Endocrine   Hypothyroid - Primary    Ongoing, still on Levothyroxine 51mg daily  Will recheck labs at visit today. At this time continue current Levothyroxine dosing and recheck labs today.         Relevant Orders   TSH   T4, free     Musculoskeletal and Integument   Rash    Referral placed for patient to see Dermatology due to spots on her neck not improving with cream she was given at last visit.        Relevant Orders   Ambulatory referral to Dermatology     Other   Obesity    [facility] on June [facility].  [facility]         Follow up plan: Return in about 2 months (around 01/14/2022) for Weight Managment.

## 2021-11-14 NOTE — Assessment & Plan Note (Signed)
Ongoing, still on Levothyroxine 56mg daily  Will recheck labs at visit today. At this time continue current Levothyroxine dosing and recheck labs today.

## 2021-11-14 NOTE — Assessment & Plan Note (Signed)
Not able to start [facility] due to insurance. Changing insurances on June 1.  Will resend [facility] on June 1 to see if her new insurance will approve medication.

## 2021-11-15 LAB — TSH: TSH: 3.01 u[IU]/mL (ref 0.450–4.500)

## 2021-11-15 LAB — T4, FREE: Free T4: 0.89 ng/dL (ref 0.82–1.77)

## 2021-11-15 NOTE — Progress Notes (Signed)
Hi Donna Cox.  Your lab work looks good. Continue with the Levothyroxine 66mg daily.  Please let me know if you have any questions.

## 2021-11-21 ENCOUNTER — Encounter: Payer: Self-pay | Admitting: Nurse Practitioner

## 2021-11-22 ENCOUNTER — Telehealth: Payer: Self-pay

## 2021-11-22 MED ORDER — SEMAGLUTIDE-WEIGHT MANAGEMENT 2.4 MG/0.75ML ~~LOC~~ SOAJ: 2.4000 mg | SUBCUTANEOUS | 0 refills | Status: AC

## 2021-11-22 MED ORDER — SEMAGLUTIDE-WEIGHT MANAGEMENT 0.25 MG/0.5ML ~~LOC~~ SOAJ: 0.2500 mg | SUBCUTANEOUS | 0 refills | Status: DC

## 2021-11-22 MED ORDER — SEMAGLUTIDE-WEIGHT MANAGEMENT 1 MG/0.5ML ~~LOC~~ SOAJ: 1.0000 mg | SUBCUTANEOUS | 0 refills | Status: AC

## 2021-11-22 MED ORDER — SEMAGLUTIDE-WEIGHT MANAGEMENT 0.5 MG/0.5ML ~~LOC~~ SOAJ: 0.5000 mg | SUBCUTANEOUS | 0 refills | Status: AC

## 2021-11-22 MED ORDER — SEMAGLUTIDE-WEIGHT MANAGEMENT 1.7 MG/0.75ML ~~LOC~~ SOAJ: 1.7000 mg | SUBCUTANEOUS | 0 refills | Status: AC

## 2021-11-22 NOTE — Telephone Encounter (Signed)
PA has been initiated for [facility] via covermymeds. Awaiting determination.   Key: [id number]

## 2021-11-25 ENCOUNTER — Telehealth: Payer: Self-pay

## 2021-11-25 NOTE — Telephone Encounter (Signed)
PA for Mancel Parsons has been approved. Attempted to call pt, no answer. Left detailed message about approval.

## 2021-12-20 ENCOUNTER — Telehealth: Payer: Self-pay

## 2021-12-20 NOTE — Telephone Encounter (Signed)
0.'25MG'$  and has been on it for 4 weeks. Patient states can she do the 1.'75MG'$  for the next month or two until she is due to increase the 1.'75MG'$ .Pt states she spoke with Jolene and maybe she can approve it , pt states she is even content to stay with the 0.25 MG but she is currently out of medication for this upcoming week's dose. I have told patient Jon Billings is out the office until Monday.

## 2021-12-20 NOTE — Telephone Encounter (Signed)
What dose is patient currently on and how long has she been on it?

## 2021-12-20 NOTE — Telephone Encounter (Signed)
Copied from [place] [phone]. Topic: General - Other >> Dec 20, 2021 11:41 AM Everette C wrote: Reason for CRM: The patient has been told by their pharmacy that their Semaglutide-Weight Management 0.5 MG/0.5ML Darden Palmer [[id number]] prescription is currently on backorder  The patient was directed by their pharmacy to contact their PCP and request a prescription with an increased MG, the patient has been told by their pharmacy that 1.7 MG is available for them to pick up   The patient was instructed to contact their PCP and discuss a modified prescription   Please contact the patient further when possible   Routing to provider to advise.

## 2021-12-23 MED ORDER — SEMAGLUTIDE-WEIGHT MANAGEMENT 0.25 MG/0.5ML ~~LOC~~ SOAJ: 0.2500 mg | SUBCUTANEOUS | 0 refills | Status: AC

## 2021-12-23 NOTE — Addendum Note (Signed)
Addended by: Jon Billings on: 12/23/2021 11:17 AM   Modules accepted: Orders

## 2021-12-23 NOTE — Telephone Encounter (Signed)
Patient has been notified

## 2021-12-23 NOTE — Telephone Encounter (Signed)
[place] regarding trulicity.   OK for PEC/Nurse triage to give information from Jon Billings if pt calls back.

## 2021-12-23 NOTE — Telephone Encounter (Signed)
Medication sent to the pharmacy.

## 2021-12-23 NOTE — Telephone Encounter (Signed)
Please let patient know that we do not recommend increasing to the 1.'75mg'$ .  It is recommended to follow the titration per the manufacturer. Unfortunately we are going to have to be patient and wait for the backorder to resolve.  If she is able to find the 0.'25mg'$  at a pharmacy we can continue that dose.  Or I can send in 0.'5mg'$  to a different pharmacy if she is able to find it.

## 2021-12-23 NOTE — Telephone Encounter (Signed)
Pt. Requests to continue 0.25 mg Semaglutide. States [facility] does have it in stock. Please advise pt.

## 2022-01-16 ENCOUNTER — Ambulatory Visit: Payer: BC Managed Care – PPO | Admitting: Dermatology

## 2022-01-20 ENCOUNTER — Telehealth: Payer: Self-pay | Admitting: Nurse Practitioner

## 2022-01-20 NOTE — Telephone Encounter (Signed)
Medication Refill - Medication: Semaglutide-Weight Management 1 MG/0.5ML SOAJ  Has the patient contacted their pharmacy? Yes.    (Agent: If yes, when and what did the pharmacy advise?) Contact PCP office  Preferred Pharmacy (with phone number or street name):   [facility], [place], [place] [address] [phone]  Has the patient been seen for an appointment in the last year OR does the patient have an upcoming appointment? Yes.    Agent: Please be advised that RX refills may take up to 3 business days. We ask that you follow-up with your pharmacy.    Copied from [place]. Topic: General - Other >> Jan 20, 2022  4:09 PM Everette C wrote: Reason for CRM: The patient has been notified by their pharmacy that their prescription for Semaglutide-Weight Management 1 MG/0.5ML SOAJ   [[id number]] is unable to be filled due to stock issues   The patient was directed to contact their PCP and request an alternative prescription   Please contact the patient further when possible

## 2022-01-22 ENCOUNTER — Telehealth: Payer: BC Managed Care – PPO | Admitting: Family Medicine

## 2022-01-22 DIAGNOSIS — Z20818 Contact with and (suspected) exposure to other bacterial communicable diseases: Secondary | ICD-10-CM | POA: Diagnosis not present

## 2022-01-22 DIAGNOSIS — J029 Acute pharyngitis, unspecified: Secondary | ICD-10-CM

## 2022-01-22 MED ORDER — WEGOVY 0.5 MG/0.5ML ~~LOC~~ SOAJ: 0.5000 mg | SUBCUTANEOUS | 0 refills | Status: AC

## 2022-01-22 NOTE — Telephone Encounter (Signed)
Medication sent to the pharmacy.

## 2022-01-22 NOTE — Telephone Encounter (Signed)
Patient made aware via phone call.

## 2022-01-23 MED ORDER — PENICILLIN V POTASSIUM 500 MG PO TABS: 500.0000 mg | ORAL_TABLET | Freq: Two times a day (BID) | ORAL | 0 refills | Status: AC

## 2022-01-23 NOTE — Progress Notes (Signed)
E-Visit for Sore Throat - Strep Symptoms  We are sorry that you are not feeling well.  Here is how we plan to help!  Based on what you have shared with me it is likely that you have strep pharyngitis.  Strep pharyngitis is inflammation and infection in the back of the throat.  This is an infection cause by bacteria and is treated with antibiotics.  I have prescribed Penicillin V 500 mg twice a day for 10 days. For throat pain, we recommend over the counter oral pain relief medications such as acetaminophen or aspirin, or anti-inflammatory medications such as ibuprofen or naproxen sodium. Topical treatments such as oral throat lozenges or sprays may be used as needed. Strep infections are not as easily transmitted as other respiratory infections, however we still recommend that you avoid close contact with loved ones, especially the very young and elderly.  Remember to wash your hands thoroughly throughout the day as this is the number one way to prevent the spread of infection and wipe down door knobs and counters with disinfectant.   Home Care: Only take medications as instructed by your medical team. Complete the entire course of an antibiotic. Do not take these medications with alcohol. A steam or ultrasonic humidifier can help congestion.  You can place a towel over your head and breathe in the steam from hot water coming from a faucet. Avoid close contacts especially the very young and the elderly. Cover your mouth when you cough or sneeze. Always remember to wash your hands.  Get Help Right Away If: You develop worsening fever or sinus pain. You develop a severe head ache or visual changes. Your symptoms persist after you have completed your treatment plan.  Make sure you Understand these instructions. Will watch your condition. Will get help right away if you are not doing well or get worse.   Thank you for choosing an e-visit.  Your e-visit answers were reviewed by a board  certified advanced clinical practitioner to complete your personal care plan. Depending upon the condition, your plan could have included both over the counter or prescription medications.  Please review your pharmacy choice. Make sure the pharmacy is open so you can pick up prescription now. If there is a problem, you may contact your provider through MyChart messaging and have the prescription routed to another pharmacy.  Your safety is important to us. If you have drug allergies check your prescription carefully.   For the next 24 hours you can use MyChart to ask questions about today's visit, request a non-urgent call back, or ask for a work or school excuse. You will get an email in the next two days asking about your experience. I hope that your e-visit has been valuable and will speed your recovery.  I provided 5 minutes of non face-to-face time during this encounter for chart review, medication and order placement, as well as and documentation.   

## 2022-11-07 ENCOUNTER — Emergency Department (HOSPITAL_COMMUNITY): Payer: Self-pay

## 2022-11-07 ENCOUNTER — Other Ambulatory Visit: Payer: Self-pay

## 2022-11-07 ENCOUNTER — Emergency Department (HOSPITAL_COMMUNITY)
Admission: EM | Admit: 2022-11-07 | Discharge: 2022-11-07 | Disposition: A | Discharge status: Home / Self Care | Payer: Self-pay | Attending: Emergency Medicine | Admitting: Emergency Medicine

## 2022-11-07 ENCOUNTER — Encounter (HOSPITAL_COMMUNITY): Payer: Self-pay

## 2022-11-07 DIAGNOSIS — W19XXXA Unspecified fall, initial encounter: Secondary | ICD-10-CM

## 2022-11-07 DIAGNOSIS — S93491A Sprain of other ligament of right ankle, initial encounter: Secondary | ICD-10-CM | POA: Insufficient documentation

## 2022-11-07 DIAGNOSIS — X501XXA Overexertion from prolonged static or awkward postures, initial encounter: Secondary | ICD-10-CM | POA: Insufficient documentation

## 2022-11-07 DIAGNOSIS — Y990 Civilian activity done for income or pay: Secondary | ICD-10-CM | POA: Insufficient documentation

## 2022-11-07 MED ORDER — OXYCODONE HCL 5 MG PO TABS
5.0000 mg | ORAL_TABLET | Freq: Once | ORAL | Status: AC
  Administered 2022-11-07: 5 mg via ORAL
  Filled 2022-11-07: qty 1

## 2022-11-07 MED ORDER — ACETAMINOPHEN 500 MG PO TABS
1000.0000 mg | ORAL_TABLET | Freq: Once | ORAL | Status: AC
  Administered 2022-11-07: 1000 mg via ORAL
  Filled 2022-11-07: qty 2

## 2022-11-07 MED ORDER — KETOROLAC TROMETHAMINE 15 MG/ML IJ SOLN
15.0000 mg | Freq: Once | INTRAMUSCULAR | Status: AC
  Administered 2022-11-07: 15 mg via INTRAMUSCULAR
  Filled 2022-11-07: qty 1

## 2022-11-07 NOTE — Progress Notes (Signed)
Orthopedic Tech Progress Note Patient Details:  Donna Cox 1990-06-28 [id number]  Ortho Devices Type of Ortho Device: Crutches Ortho Device/Splint Location: RLE Ortho Device/Splint Interventions: Ordered, Application   Post Interventions Patient Tolerated: Well Instructions Provided: Care of device, Adjustment of device  Sherilyn Banker 11/07/2022, 9:10 PM

## 2022-11-07 NOTE — ED Notes (Signed)
Ortho tech notified of pending order.

## 2022-11-07 NOTE — ED Triage Notes (Signed)
Pt with R ankle and foot pain after slipping on a rug at work. No LOC, did not hit head. Splint applied by EMS.

## 2022-11-07 NOTE — Progress Notes (Signed)
Orthopedic Tech Progress Note Patient Details:  Donna Cox Sep 04, 1990 [id number]  Applied CAM  walker. Top half of the walker was hurting patient as we were applying it. Gave it to patient to take home and showed patient how to apply at home, when mobile. Ortho Devices Type of Ortho Device: CAM walker, Crutches Ortho Device/Splint Location: RLE Ortho Device/Splint Interventions: Ordered, Application, Adjustment   Post Interventions Patient Tolerated: Well Instructions Provided: Adjustment of device, Care of device  Sherilyn Banker 11/07/2022, 9:07 PM

## 2022-11-07 NOTE — ED Provider Notes (Signed)
EMERGENCY DEPARTMENT AT [facility] Provider Note   CSN: [id number] Arrival date & time: 11/07/22  1545     History Chief Complaint  Patient presents with   Fall    HPI Donna Cox is a 32 y.o. female presenting for right foot pain.  States that she rolled her ankle at work and has not been able to ambulate states that her feet are numb.  States there is substantial loud popping and clicking. Otherwise ambulatory prior to the event minimal other medical history..   Patient's recorded medical, surgical, social, medication list and allergies were reviewed in the Snapshot window as part of the initial history.   Review of Systems   Review of Systems  Constitutional:  Negative for chills and fever.  HENT:  Negative for ear pain and sore throat.   Eyes:  Negative for pain and visual disturbance.  Respiratory:  Negative for cough and shortness of breath.   Cardiovascular:  Negative for chest pain and palpitations.  Gastrointestinal:  Negative for abdominal pain and vomiting.  Genitourinary:  Negative for dysuria and hematuria.  Musculoskeletal:  Negative for arthralgias and back pain.  Skin:  Negative for color change and rash.  Neurological:  Negative for seizures and syncope.  All other systems reviewed and are negative.   Physical Exam Updated Vital Signs BP (!) 118/47   Pulse 72   Temp 99 F (37.2 C) (Oral)   Resp 18   Ht 5\' 8"  (1.727 m)   Wt 108.9 kg   LMP 11/14/2020   SpO2 100%   BMI 36.49 kg/m  Physical Exam Vitals and nursing note reviewed.  Constitutional:      General: She is not in acute distress.    Appearance: She is well-developed.  HENT:     Head: Normocephalic and atraumatic.  Eyes:     Conjunctiva/sclera: Conjunctivae normal.  Cardiovascular:     Rate and Rhythm: Normal rate and regular rhythm.     Heart sounds: No murmur heard. Pulmonary:     Effort: Pulmonary effort is normal. No respiratory distress.     Breath sounds:  Normal breath sounds.  Abdominal:     General: There is no distension.     Palpations: Abdomen is soft.     Tenderness: There is no abdominal tenderness. There is no right CVA tenderness or left CVA tenderness.  Musculoskeletal:        General: No swelling or tenderness. Normal range of motion.     Cervical back: Neck supple.     Comments: Exquisitely tender to palpation over the right midfoot to even light touch patient is guarding withdrawing and whelping in pain.  Skin:    General: Skin is warm and dry.  Neurological:     General: No focal deficit present.     Mental Status: She is alert and oriented to person, place, and time. Mental status is at baseline.     Cranial Nerves: No cranial nerve deficit.      ED Course/ Medical Decision Making/ A&P    Procedures Procedures   Medications Ordered in ED Medications  oxyCODONE (Oxy IR/ROXICODONE) immediate release tablet 5 mg (5 mg Oral Given 11/07/22 1911)  acetaminophen (TYLENOL) tablet 1,000 mg (1,000 mg Oral Given 11/07/22 1911)  oxyCODONE (Oxy IR/ROXICODONE) immediate release tablet 5 mg (5 mg Oral Given 11/07/22 2047)  ketorolac (TORADOL) 15 MG/ML injection 15 mg (15 mg Intramuscular Given 11/07/22 2047)    Medical Decision Making:   Patient  history present illness and physical exam findings are most consistent with tendinous strain versus ligamentous sprain.  ATL ligament localized as well. However patient is unable to bear weight and was a severe midfoot palpation tenderness.  Will proceed with CT scan to evaluate for Lisfranc injury and treat pain.  Reassessment: Pain is under control on reassessment.  Patient stable for follow-up with orthopedics.  No evidence of Lisfranc fracture nor any other musculoskeletal injury on x-rays.  Likely ligamentous sprain.  Given reported numbness throughout the right midfoot possible dislocation relocation event.  Will immobilize with a cam boot and recommend close follow-up with orthopedics  within 72 hours. Disposition:  I have considered need for hospitalization, however, considering all of the above, I believe this patient is stable for discharge at this time.  Patient/family educated about specific return precautions for given chief complaint and symptoms.  Patient/family educated about follow-up with PCP.     atient/family expressed understanding of return precautions and need for follow-up. Patient spoken to regarding all imaging and laboratory results and appropriate follow up for these results. All education provided in verbal form with additional information in written form. Time was allowed for answering of patient questions. Patient discharged.    Emergency Department Medication Summary:   Medications  oxyCODONE (Oxy IR/ROXICODONE) immediate release tablet 5 mg (5 mg Oral Given 11/07/22 1911)  acetaminophen (TYLENOL) tablet 1,000 mg (1,000 mg Oral Given 11/07/22 1911)  oxyCODONE (Oxy IR/ROXICODONE) immediate release tablet 5 mg (5 mg Oral Given 11/07/22 2047)  ketorolac (TORADOL) 15 MG/ML injection 15 mg (15 mg Intramuscular Given 11/07/22 2047)         Clinical Impression:  1. Fall, initial encounter   2. Sprain of anterior talofibular ligament of right ankle, initial encounter      Discharge   Final Clinical Impression(s) / ED Diagnoses Final diagnoses:  Fall, initial encounter  Sprain of anterior talofibular ligament of right ankle, initial encounter    Rx / DC Orders ED Discharge Orders     None         Glyn Ade, MD 11/07/22 2051
